# Patient Record
Sex: Male | Born: 1955
Health system: Southern US, Community
[De-identification: ages and names within clinical notes are randomized; demographics above are authoritative.]

## PROBLEM LIST (undated history)

## (undated) DIAGNOSIS — H905 Unspecified sensorineural hearing loss: Secondary | ICD-10-CM

## (undated) DIAGNOSIS — D229 Melanocytic nevi, unspecified: Secondary | ICD-10-CM

## (undated) DIAGNOSIS — S83519A Sprain of anterior cruciate ligament of unspecified knee, initial encounter: Secondary | ICD-10-CM

## (undated) DIAGNOSIS — E785 Hyperlipidemia, unspecified: Secondary | ICD-10-CM

## (undated) DIAGNOSIS — T7840XA Allergy, unspecified, initial encounter: Secondary | ICD-10-CM

## (undated) DIAGNOSIS — M6740 Ganglion, unspecified site: Secondary | ICD-10-CM

## (undated) HISTORY — PX: APPENDECTOMY: SHX54

## (undated) HISTORY — DX: Unspecified sensorineural hearing loss: H90.5

## (undated) HISTORY — DX: Allergy, unspecified, initial encounter: T78.40XA

## (undated) HISTORY — PX: VASECTOMY: SHX75

## (undated) HISTORY — PX: GANGLION CYST EXCISION: SHX1691

## (undated) HISTORY — DX: Ganglion, unspecified site: M67.40

## (undated) HISTORY — DX: Melanocytic nevi, unspecified: D22.9

## (undated) HISTORY — DX: Hyperlipidemia, unspecified: E78.5

## (undated) HISTORY — DX: Sprain of anterior cruciate ligament of unspecified knee, initial encounter: S83.519A

---
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
## 1999-08-14 ENCOUNTER — Other Ambulatory Visit: Admission: RE | Admit: 1999-08-14 | Discharge: 1999-08-14 | Payer: Self-pay | Admitting: Orthopedic Surgery

## 2001-11-18 ENCOUNTER — Ambulatory Visit (HOSPITAL_COMMUNITY): Admission: RE | Admit: 2001-11-18 | Discharge: 2001-11-18 | Payer: Self-pay | Admitting: Internal Medicine

## 2001-11-18 ENCOUNTER — Encounter: Payer: Self-pay | Admitting: Internal Medicine

## 2003-09-11 ENCOUNTER — Encounter: Admission: RE | Admit: 2003-09-11 | Discharge: 2003-09-11 | Payer: Self-pay | Admitting: Internal Medicine

## 2004-12-30 ENCOUNTER — Ambulatory Visit: Payer: Self-pay | Admitting: Internal Medicine

## 2005-04-02 ENCOUNTER — Encounter: Admission: RE | Admit: 2005-04-02 | Discharge: 2005-04-02 | Payer: Self-pay | Admitting: Internal Medicine

## 2005-04-02 ENCOUNTER — Ambulatory Visit: Payer: Self-pay | Admitting: Internal Medicine

## 2005-04-08 ENCOUNTER — Encounter: Admission: RE | Admit: 2005-04-08 | Discharge: 2005-04-29 | Payer: Self-pay | Admitting: Internal Medicine

## 2005-08-24 DIAGNOSIS — D229 Melanocytic nevi, unspecified: Secondary | ICD-10-CM

## 2005-08-24 HISTORY — DX: Melanocytic nevi, unspecified: D22.9

## 2006-01-05 ENCOUNTER — Ambulatory Visit: Payer: Self-pay | Admitting: Internal Medicine

## 2006-07-27 ENCOUNTER — Ambulatory Visit: Payer: Self-pay | Admitting: Internal Medicine

## 2006-08-10 ENCOUNTER — Ambulatory Visit: Payer: Self-pay | Admitting: Internal Medicine

## 2007-01-06 DIAGNOSIS — E785 Hyperlipidemia, unspecified: Secondary | ICD-10-CM | POA: Insufficient documentation

## 2007-01-06 DIAGNOSIS — Q169 Congenital malformation of ear causing impairment of hearing, unspecified: Secondary | ICD-10-CM

## 2007-01-10 ENCOUNTER — Encounter: Payer: Self-pay | Admitting: Internal Medicine

## 2007-01-10 ENCOUNTER — Ambulatory Visit: Payer: Self-pay | Admitting: Internal Medicine

## 2007-01-10 DIAGNOSIS — J309 Allergic rhinitis, unspecified: Secondary | ICD-10-CM | POA: Insufficient documentation

## 2007-01-10 DIAGNOSIS — D239 Other benign neoplasm of skin, unspecified: Secondary | ICD-10-CM | POA: Insufficient documentation

## 2007-01-18 LAB — CONVERTED CEMR LAB
ALT: 33 units/L (ref 0–40)
AST: 23 units/L (ref 0–37)
Albumin: 4.1 g/dL (ref 3.5–5.2)
Alkaline Phosphatase: 48 units/L (ref 39–117)
BUN: 13 mg/dL (ref 6–23)
Bilirubin, Direct: 0.1 mg/dL (ref 0.0–0.3)
CO2: 32 meq/L (ref 19–32)
Calcium: 9.4 mg/dL (ref 8.4–10.5)
Chloride: 109 meq/L (ref 96–112)
Cholesterol: 189 mg/dL (ref 0–200)
Creatinine, Ser: 0.9 mg/dL (ref 0.4–1.5)
GFR calc Af Amer: 115 mL/min
GFR calc non Af Amer: 95 mL/min
Glucose, Bld: 98 mg/dL (ref 70–99)
HDL: 47.6 mg/dL (ref >39.0)
Hemoglobin: 15.4 g/dL (ref 13.0–17.0)
LDL Cholesterol: 114 mg/dL — ABNORMAL HIGH (ref 0–99)
PSA: 1.74 ng/mL (ref 0.10–4.00)
Potassium: 4.3 meq/L (ref 3.5–5.1)
Sodium: 145 meq/L (ref 135–145)
Total Bilirubin: 0.7 mg/dL (ref 0.3–1.2)
Total CHOL/HDL Ratio: 4
Total Protein: 6.4 g/dL (ref 6.0–8.3)
Triglycerides: 135 mg/dL (ref 0–149)
VLDL: 27 mg/dL (ref 0–40)

## 2007-07-28 ENCOUNTER — Ambulatory Visit: Payer: Self-pay | Admitting: Internal Medicine

## 2007-08-25 HISTORY — PX: ROTATOR CUFF REPAIR: SHX139

## 2007-11-01 ENCOUNTER — Encounter: Admission: RE | Admit: 2007-11-01 | Discharge: 2007-12-14 | Payer: Self-pay | Admitting: Orthopedic Surgery

## 2007-11-29 ENCOUNTER — Ambulatory Visit: Payer: Self-pay | Admitting: Internal Medicine

## 2007-11-29 DIAGNOSIS — K644 Residual hemorrhoidal skin tags: Secondary | ICD-10-CM | POA: Insufficient documentation

## 2007-11-30 LAB — CONVERTED CEMR LAB
ALT: 35 units/L (ref 0–53)
AST: 22 units/L (ref 0–37)
BUN: 11 mg/dL (ref 6–23)
CO2: 32 meq/L (ref 19–32)
Calcium: 9.5 mg/dL (ref 8.4–10.5)
Chloride: 102 meq/L (ref 96–112)
Cholesterol: 173 mg/dL (ref 0–200)
Creatinine, Ser: 0.9 mg/dL (ref 0.4–1.5)
GFR calc Af Amer: 115 mL/min
GFR calc non Af Amer: 95 mL/min
Glucose, Bld: 94 mg/dL (ref 70–99)
HDL: 47.8 mg/dL (ref >39.0)
LDL Cholesterol: 92 mg/dL (ref 0–99)
Potassium: 4 meq/L (ref 3.5–5.1)
Sodium: 141 meq/L (ref 135–145)
Total CHOL/HDL Ratio: 3.6
Triglycerides: 168 mg/dL — ABNORMAL HIGH (ref 0–149)
VLDL: 34 mg/dL (ref 0–40)

## 2008-02-29 ENCOUNTER — Ambulatory Visit: Payer: Self-pay | Admitting: Internal Medicine

## 2008-03-02 LAB — CONVERTED CEMR LAB
ALT: 30 units/L (ref 0–53)
AST: 27 units/L (ref 0–37)
Cholesterol: 172 mg/dL (ref 0–200)
HDL: 45.4 mg/dL (ref >39.0)
LDL Cholesterol: 103 mg/dL — ABNORMAL HIGH (ref 0–99)
PSA: 1.13 ng/mL (ref 0.10–4.00)
Total CHOL/HDL Ratio: 3.8
Triglycerides: 116 mg/dL (ref 0–149)
VLDL: 23 mg/dL (ref 0–40)

## 2008-06-13 ENCOUNTER — Ambulatory Visit: Payer: Self-pay | Admitting: Internal Medicine

## 2008-06-14 LAB — CONVERTED CEMR LAB
BUN: 14 mg/dL (ref 6–23)
Basophils Relative: 0 % (ref 0.0–3.0)
CO2: 29 meq/L (ref 19–32)
Calcium: 9.3 mg/dL (ref 8.4–10.5)
Chloride: 105 meq/L (ref 96–112)
Creatinine, Ser: 1 mg/dL (ref 0.4–1.5)
Eosinophils Relative: 2.1 % (ref 0.0–5.0)
Glucose, Bld: 99 mg/dL (ref 70–99)
Hemoglobin: 15.6 g/dL (ref 13.0–17.0)
Lymphocytes Relative: 19.1 % (ref 12.0–46.0)
MCHC: 35.3 g/dL (ref 30.0–36.0)
MCV: 87.8 fL (ref 78.0–100.0)
Neutro Abs: 5.2 10*3/uL (ref 1.4–7.7)
Neutrophils Relative %: 70.1 % (ref 43.0–77.0)
RBC: 5.03 M/uL (ref 4.22–5.81)
WBC: 7.5 10*3/uL (ref 4.5–10.5)

## 2008-06-15 ENCOUNTER — Encounter (INDEPENDENT_AMBULATORY_CARE_PROVIDER_SITE_OTHER): Payer: Self-pay | Admitting: *Deleted

## 2008-06-22 ENCOUNTER — Ambulatory Visit: Payer: Self-pay | Admitting: Internal Medicine

## 2008-07-06 ENCOUNTER — Ambulatory Visit: Payer: Self-pay | Admitting: Internal Medicine

## 2008-07-06 DIAGNOSIS — Z860100 Personal history of colon polyps, unspecified: Secondary | ICD-10-CM | POA: Insufficient documentation

## 2008-07-18 ENCOUNTER — Encounter: Payer: Self-pay | Admitting: Internal Medicine

## 2008-09-18 ENCOUNTER — Telehealth (INDEPENDENT_AMBULATORY_CARE_PROVIDER_SITE_OTHER): Payer: Self-pay | Admitting: *Deleted

## 2008-12-06 ENCOUNTER — Ambulatory Visit: Payer: Self-pay | Admitting: Internal Medicine

## 2009-06-20 ENCOUNTER — Ambulatory Visit: Payer: Self-pay | Admitting: Internal Medicine

## 2009-08-07 ENCOUNTER — Encounter: Admission: RE | Admit: 2009-08-07 | Discharge: 2009-08-20 | Payer: Self-pay | Admitting: Orthopedic Surgery

## 2010-06-23 ENCOUNTER — Ambulatory Visit: Payer: Self-pay | Admitting: Internal Medicine

## 2010-06-23 LAB — CONVERTED CEMR LAB
Bilirubin Urine: NEGATIVE
Blood in Urine, dipstick: NEGATIVE
Glucose, Urine, Semiquant: NEGATIVE
Specific Gravity, Urine: 1.025
WBC Urine, dipstick: NEGATIVE
pH: 5

## 2010-06-26 LAB — CONVERTED CEMR LAB
AST: 24 units/L (ref 0–37)
Basophils Relative: 0.4 % (ref 0.0–3.0)
Calcium: 9.1 mg/dL (ref 8.4–10.5)
Chloride: 100 meq/L (ref 96–112)
Cholesterol: 139 mg/dL (ref 0–200)
Creatinine, Ser: 1 mg/dL (ref 0.4–1.5)
Eosinophils Relative: 4.6 % (ref 0.0–5.0)
HDL: 43.1 mg/dL (ref >39.00)
Lymphocytes Relative: 9.1 % — ABNORMAL LOW (ref 12.0–46.0)
Monocytes Relative: 7.9 % (ref 3.0–12.0)
Neutrophils Relative %: 78 % — ABNORMAL HIGH (ref 43.0–77.0)
RBC: 5.05 M/uL (ref 4.22–5.81)
TSH: 0.98 microintl units/mL (ref 0.35–5.50)
VLDL: 21.6 mg/dL (ref 0.0–40.0)
WBC: 10 10*3/uL (ref 4.5–10.5)

## 2010-09-12 ENCOUNTER — Telehealth (INDEPENDENT_AMBULATORY_CARE_PROVIDER_SITE_OTHER): Payer: Self-pay | Admitting: *Deleted

## 2010-09-21 LAB — CONVERTED CEMR LAB
BUN: 15 mg/dL (ref 6–23)
Basophils Absolute: 0 10*3/uL (ref 0.0–0.1)
CO2: 27 meq/L (ref 19–32)
Chloride: 99 meq/L (ref 96–112)
GFR calc non Af Amer: 83.58 mL/min (ref >60)
Glucose, Bld: 99 mg/dL (ref 70–99)
HCT: 45.2 % (ref 39.0–52.0)
Hemoglobin: 15.3 g/dL (ref 13.0–17.0)
Lymphs Abs: 1.5 10*3/uL (ref 0.7–4.0)
MCHC: 33.8 g/dL (ref 30.0–36.0)
MCV: 90.5 fL (ref 78.0–100.0)
Monocytes Absolute: 0.5 10*3/uL (ref 0.1–1.0)
Monocytes Relative: 7.7 % (ref 3.0–12.0)
Neutro Abs: 4.1 10*3/uL (ref 1.4–7.7)
PSA: 1.58 ng/mL (ref 0.10–4.00)
Potassium: 3.9 meq/L (ref 3.5–5.1)
RDW: 11.6 % (ref 11.5–14.6)
Sodium: 135 meq/L (ref 135–145)
VLDL: 18.6 mg/dL (ref 0.0–40.0)

## 2010-09-23 NOTE — Assessment & Plan Note (Signed)
Summary: cpx /lab/cbs   Vital Signs:  Patient profile:   55 year old male Height:      68.25 inches Weight:      201.38 pounds BMI:     30.51 Pulse rate:   76 / minute Pulse rhythm:   regular BP sitting:   124 / 86  (left arm) Cuff size:   large  Vitals Entered By: Army Fossa CMA (June 23, 2010 9:03 AM) CC: CPX, fasting Comments c/o head congestion since saturday- taking dayquil discuss td  declines flu shot [facility] [address]    History of Present Illness: CPX  Current Medications (verified): 1)  Crestor 10 Mg  Tabs (Rosuvastatin Calcium) .Marland Kitchen.. 1 By Mouth At Bedtime 2)  Chromium Picolinate 500 Mcg Tabs (Chromium Picolinate) .... Two Times A Day 3)  Fish Oil 1400 Mg  Allergies (verified): No Known Drug Allergies  Past History:  Past Medical History: Reviewed history from 06/13/2008 and no changes required. deafness, CONGENITAL HYPERLIPIDEMIA  ALLERGIC RHINITIS  NEVUS, ATYPICAL --2007, Dr Maricela Bo CYST X2    Past Surgical History: Reviewed history from 06/13/2008 and no changes required. Appendectomy Vasectomy Tonsillectomy & adenoidectomy rotato cuff surgery  2-09  Family History: Reviewed history from 06/13/2008 and no changes required. prostate cancer--no colon ca--no coronary artery disease--no  strokes--no  diabetes--no hypertension-- parents  Social History: Reviewed history from 06/20/2009 and no changes required. Married 2 daughters tobacco--no ETOH-- socially exercise -- gym x 3 eats healthy patient is a Architectural technologist, works from home   Review of Systems General:  in general doing well. CV:  no chest pain or lower extremity edema. Resp:  having a cold for the last 3 days, other than the cold he denies cough. Occasionally gets short of breath  ("hard to get a deep breath"). GI:  Denies abdominal pain, bloody stools, nausea, and vomiting; no GERD symptoms. GU:  his urinary stream is slightly lower than before. No gross  hematuria or dysuria. Derm:  reports that he saw his dermatologist  few months ago but since then, a mole in the scalp has increased in size.  Physical Exam  General:  alert, well-developed, and overweight-appearing.   Head:  face symmetric, not tender to palpation Ears:  R ear normal and L ear normal.   Nose:  congestive Mouth:  no redness or discharge Lungs:  normal respiratory effort, no intercostal retractions, no accessory muscle use, and normal breath sounds.   Heart:  normal rate, regular rhythm, no murmur, and no gallop.   Abdomen:  soft, non-tender, no distention, no masses, no guarding, and no rigidity.   Rectal:  a small skin tag noted outside the anus. Normal sphincter tone. No rectal masses or tenderness. Prostate:  Prostate gland firm and smooth, no enlargement, nodularity, tenderness, mass, asymmetry or induration. Extremities:  no pretibial edema bilaterally  Psych:  Oriented X3, memory intact for recent and remote, normally interactive, good eye contact, not anxious appearing, and not depressed appearing.     Impression & Recommendations:  Problem # 1:  HEALTH MAINTENANCE EXAM (ICD-V70.0) Td 08 continue healthy life style   Cscope 11-09 4 MM ASCENDING COLON POLYP REMOVED, NOT RECOVERED, SIGMOID DIVERTICULOSIS, OTHERWISE NORMAL   explained benefit of flu shot--declined  ('I get sick')   PT IS DEAF, PHONE #  [phone] (RELAY PHONE)  Recommend to continue to be physically active, information provided regards diet   Orders: Venipuncture (09811) TLB-BMP (Basic Metabolic Panel-BMET) (80048-METABOL) TLB-CBC Platelet - w/Differential (85025-CBCD) TLB-ALT (  SGPT) (84460-ALT) TLB-AST (SGOT) (84450-SGOT) TLB-Lipid Panel (80061-LIPID) TLB-PSA (Prostate Specific Antigen) (84153-PSA) TLB-TSH (Thyroid Stimulating Hormone) (84443-TSH)  Problem # 2:  URI see instructions   Problem # 3:  NEVUS, ATYPICAL (ICD-216.9) a mole in his scalp has increased in size, recommend  to see his dermatologist  Complete Medication List: 1)  Crestor 10 Mg Tabs (Rosuvastatin calcium) .Marland Kitchen.. 1 by mouth at bedtime 2)  Chromium Picolinate 500 Mcg Tabs (Chromium picolinate) .... Two times a day 3)  Fish Oil 1400 Mg   Patient Instructions: 1)  for the cold, take fluids, Mucinex DM, Tylenol if needed and call in few days if you are no better 2)  please see your dermatologist in reference to the mole in your scalp 3)  Come back in one year, fasting, physical  exam   Orders Added: 1)  Venipuncture [36415] 2)  TLB-BMP (Basic Metabolic Panel-BMET) [80048-METABOL] 3)  TLB-CBC Platelet - w/Differential [85025-CBCD] 4)  TLB-ALT (SGPT) [84460-ALT] 5)  TLB-AST (SGOT) [84450-SGOT] 6)  TLB-Lipid Panel [80061-LIPID] 7)  TLB-PSA (Prostate Specific Antigen) [84153-PSA] 8)  TLB-TSH (Thyroid Stimulating Hormone) [84443-TSH] 9)  Est. Patient age 42-64 [99396]      Laboratory Results   Urine Tests  Date/Time Received: Lucious Groves [facility]  June 23, 2010 10:22 AM Date/Time Reported: Lucious Groves CMA  June 23, 2010 10:22 AM  Routine Urinalysis   Color: yellow Appearance: Clear Glucose: negative   (Normal Range: Negative) Bilirubin: negative   (Normal Range: Negative) Ketone: negative   (Normal Range: Negative) Spec. Gravity: 1.025   (Normal Range: 1.003-1.035) Blood: negative   (Normal Range: Negative) pH: 5.0   (Normal Range: 5.0-8.0) Protein: negative   (Normal Range: Negative) Urobilinogen: 0.2   (Normal Range: 0-1) Nitrite: negative   (Normal Range: Negative) Leukocyte Esterace: negative   (Normal Range: Negative)    Comments: No cause for culture. Lucious Groves CMA  June 23, 2010 10:22 AM

## 2010-09-25 NOTE — Progress Notes (Signed)
Summary: Refill Request  Phone Note Refill Request Call back at [phone] Message from:  Pharmacy on September 12, 2010 11:23 AM  Refills Requested: Medication #1:  CRESTOR 10 MG  TABS 1 by mouth at bedtime   Dosage confirmed as above?Dosage Confirmed   Supply Requested: 3 months   Last Refilled: 07/03/2010 [facility]  Next Appointment Scheduled: none Initial call taken by: Harold Barban,  September 12, 2010 11:23 AM    Prescriptions: CRESTOR 10 MG  TABS (ROSUVASTATIN CALCIUM) 1 by mouth at bedtime  #90 Tablet x 1   Entered and Authorized by:   Army Fossa CMA   Signed by:   Army Fossa CMA on 09/12/2010   Method used:   Faxed to ...       [facility]order)       [address] [place], [place]  [address]       Ph: [phone]       Fax: [phone]   RxID:   [id number]

## 2011-04-20 ENCOUNTER — Other Ambulatory Visit: Payer: Self-pay | Admitting: Internal Medicine

## 2011-05-04 ENCOUNTER — Other Ambulatory Visit: Payer: Self-pay | Admitting: Internal Medicine

## 2011-05-06 MED ORDER — ROSUVASTATIN CALCIUM 10 MG PO TABS: 10.0000 mg | ORAL_TABLET | Freq: Every day | ORAL | Status: DC

## 2011-05-06 NOTE — Telephone Encounter (Signed)
Done

## 2011-06-25 ENCOUNTER — Encounter: Payer: Self-pay | Admitting: Internal Medicine

## 2011-06-29 ENCOUNTER — Encounter: Payer: Self-pay | Admitting: Internal Medicine

## 2011-06-29 ENCOUNTER — Ambulatory Visit (INDEPENDENT_AMBULATORY_CARE_PROVIDER_SITE_OTHER): Payer: Self-pay | Admitting: Internal Medicine

## 2011-06-29 DIAGNOSIS — D239 Other benign neoplasm of skin, unspecified: Secondary | ICD-10-CM

## 2011-06-29 DIAGNOSIS — Z0000 Encounter for general adult medical examination without abnormal findings: Secondary | ICD-10-CM | POA: Insufficient documentation

## 2011-06-29 LAB — COMPREHENSIVE METABOLIC PANEL
AST: 22 U/L (ref 0–37)
Albumin: 4.4 g/dL (ref 3.5–5.2)
Alkaline Phosphatase: 57 U/L (ref 39–117)
Calcium: 9.6 mg/dL (ref 8.4–10.5)
Chloride: 104 mEq/L (ref 96–112)
Glucose, Bld: 109 mg/dL — ABNORMAL HIGH (ref 70–99)
Potassium: 4.5 mEq/L (ref 3.5–5.1)
Sodium: 142 mEq/L (ref 135–145)
Total Protein: 7 g/dL (ref 6.0–8.3)

## 2011-06-29 LAB — CBC WITH DIFFERENTIAL/PLATELET
Basophils Absolute: 0 10*3/uL (ref 0.0–0.1)
HCT: 44.8 % (ref 39.0–52.0)
Hemoglobin: 15.2 g/dL (ref 13.0–17.0)
Lymphs Abs: 1.3 10*3/uL (ref 0.7–4.0)
MCHC: 33.9 g/dL (ref 30.0–36.0)
Monocytes Relative: 6.8 % (ref 3.0–12.0)
Neutro Abs: 4.2 10*3/uL (ref 1.4–7.7)
RDW: 12.6 % (ref 11.5–14.6)

## 2011-06-29 LAB — LIPID PANEL
Cholesterol: 174 mg/dL (ref 0–200)
LDL Cholesterol: 92 mg/dL (ref 0–99)
Total CHOL/HDL Ratio: 3

## 2011-06-29 LAB — TSH: TSH: 0.95 u[IU]/mL (ref 0.35–5.50)

## 2011-06-29 MED ORDER — ROSUVASTATIN CALCIUM 10 MG PO TABS: 10.0000 mg | ORAL_TABLET | Freq: Every day | ORAL | Status: DC

## 2011-06-29 NOTE — Assessment & Plan Note (Signed)
Reports he was cleared by dermatology, no further eval at this time

## 2011-06-29 NOTE — Patient Instructions (Signed)
Will call results to: [phone] (RELAY PHONE)

## 2011-06-29 NOTE — Progress Notes (Signed)
  Subjective:    Patient ID: Brian Gibson, male    DOB: 1956-07-24, 55 y.o.   MRN: [id number]  HPI Complete physical exam, doing well, recently participated in a boot camp, lost weight and feeling great.  Past Medical History  Diagnosis Date  . Congenital deafness   . Hyperlipidemia   . Allergy     RHINITIS  . Atypical nevus 2007    dr Elliot Gurney  . Ganglion    Past Surgical History  Procedure Date  . Appendectomy   . Vasectomy   . Rotator cuff repair 2009  . 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 Denies any anxiety or depression, occasionally has difficulty sleeping,  sx well controlled with over-the-counter meds.     Objective:   Physical Exam  Constitutional: He is oriented to person, place, and time. He appears well-developed and well-nourished. No distress.  HENT:  Head: Normocephalic and atraumatic.  Neck: No thyromegaly present.       Normal carotid pulses  Cardiovascular: Normal rate, regular rhythm and normal heart sounds.   No murmur heard. Pulmonary/Chest: Effort normal and breath sounds normal. No respiratory distress. He has no wheezes. He has no rales.  Abdominal: Soft. Bowel sounds are normal. He exhibits no distension. There is no tenderness. There is no rebound and no guarding.  Genitourinary: Rectum normal and prostate normal.  Musculoskeletal: He exhibits no edema.  Neurological: He is alert and oriented to person, place, and time.  Skin: Skin is warm and dry. He is not diaphoretic.    Psychiatric: He has a normal mood and affect. His behavior is normal. Judgment and thought content normal.       Assessment & Plan:

## 2011-06-29 NOTE — Assessment & Plan Note (Signed)
Td 08 Offered a flu shot Cscope 11-09 4 MM ASCENDING COLON POLYP REMOVED, NOT RECOVERED, SIGMOID DIVERTICULOSIS, OTHERWISE NORMAL --- next 2014 per report  PT IS DEAF, PHONE #  [phone] (RELAY PHONE) Counseled about diet and physically active

## 2011-07-02 ENCOUNTER — Telehealth: Payer: Self-pay

## 2011-07-02 NOTE — Telephone Encounter (Signed)
Message copied by Francisco Capuchin on Thu Jul 02, 2011  8:12 AM ------      Message from: Willow Ora E      Created: Mon Jun 29, 2011  6:37 PM       PT IS DEAF,  CALL RESULTS TO #  [phone] (RELAY PHONE)      All labs ok, blood sugar slt elevated, needs to be rechecked yearly      Good results

## 2011-07-02 NOTE — Telephone Encounter (Signed)
Pt called no answer, letter with results mailed to home adressed 

## 2011-07-02 NOTE — Telephone Encounter (Signed)
Pt called no answer, letter with results mailed to home adressed

## 2011-10-07 ENCOUNTER — Other Ambulatory Visit: Payer: Self-pay | Admitting: Internal Medicine

## 2011-10-07 ENCOUNTER — Telehealth: Payer: Self-pay | Admitting: Internal Medicine

## 2011-10-07 MED ORDER — ROSUVASTATIN CALCIUM 10 MG PO TABS: 10.0000 mg | ORAL_TABLET | Freq: Every day | ORAL | Status: DC

## 2011-10-07 NOTE — Telephone Encounter (Signed)
The pt is hoping to get a refill on his crestor 10mg  tab  Sent to [facility]   Thanks!

## 2011-10-07 NOTE — Telephone Encounter (Signed)
Please advise 

## 2011-10-07 NOTE — Telephone Encounter (Signed)
Refill done.  

## 2011-10-07 NOTE — Telephone Encounter (Signed)
[facility] has faxed over a form and stated they do not cover Crestor 10mg  tabs.  They are hoping for a generic substitute for this med.  Please fax over new med rx to [phone] Thanks!

## 2011-10-08 MED ORDER — ATORVASTATIN CALCIUM 20 MG PO TABS: 20.0000 mg | ORAL_TABLET | Freq: Every day | ORAL | Status: DC

## 2011-10-08 NOTE — Telephone Encounter (Signed)
Changed to Lipitor 20 mg one by mouth each bedtime, call #[phone],  4 refills.  Scheduled visit in 2 months, fasting

## 2011-10-08 NOTE — Telephone Encounter (Signed)
Sent in prescription for Lipitor. Tried to schedule pt for labs but he refused. Pt states he is low on money & does not want to pay.

## 2011-10-12 ENCOUNTER — Other Ambulatory Visit: Payer: Self-pay | Admitting: Internal Medicine

## 2011-10-12 MED ORDER — ATORVASTATIN CALCIUM 20 MG PO TABS: 20.0000 mg | ORAL_TABLET | Freq: Every day | ORAL | Status: DC

## 2011-10-12 NOTE — Telephone Encounter (Signed)
Refill done.  

## 2011-12-09 ENCOUNTER — Telehealth: Payer: Self-pay | Admitting: *Deleted

## 2011-12-09 MED ORDER — FLUTICASONE PROPIONATE 50 MCG/ACT NA SUSP: 2.0000 | Freq: Every day | NASAL | Status: DC

## 2011-12-09 NOTE — Telephone Encounter (Signed)
Flonase 2 sprays on each side of the nose daily, #1 bottle, 3 refills. Office visit if symptoms severe or if he has fever or chills.

## 2011-12-09 NOTE — Telephone Encounter (Signed)
Pt left VM that he has been bother with sneezing,nasal and throat drainage,watery eyes. Pt notes that he has been using his wife Rx for fluticasone and it has help with his symptoms. Pt is now requesting to have Rx sent in for him. .please advise

## 2011-12-09 NOTE — Telephone Encounter (Signed)
Spoke with pt, sent in rx.  

## 2012-04-28 ENCOUNTER — Telehealth: Payer: Self-pay | Admitting: Internal Medicine

## 2012-04-28 NOTE — Telephone Encounter (Signed)
Patient calling, has had lower abd. pain x 7 days.  He is able to urinate and have bm's.  The pain is intermittent and on the lower right side.  Had an appendectomy.  The pain worsens with movement and walking.  No fever.  No blood in the stools or urine.  No decreased appetite.   Home care given.  He will call if any sx worsens.  He uses an interpretor and asks that we give them time to answer as well.

## 2012-06-30 ENCOUNTER — Ambulatory Visit (INDEPENDENT_AMBULATORY_CARE_PROVIDER_SITE_OTHER): Payer: BC Managed Care – HMO | Admitting: Internal Medicine

## 2012-06-30 ENCOUNTER — Encounter: Payer: Self-pay | Admitting: Internal Medicine

## 2012-06-30 VITALS — BP 132/76 | HR 65 | Temp 98.8°F | Ht 68.5 in | Wt 197.0 lb

## 2012-06-30 DIAGNOSIS — Z0000 Encounter for general adult medical examination without abnormal findings: Secondary | ICD-10-CM

## 2012-06-30 DIAGNOSIS — E785 Hyperlipidemia, unspecified: Secondary | ICD-10-CM

## 2012-06-30 LAB — POCT URINALYSIS DIPSTICK
Bilirubin, UA: NEGATIVE
Blood, UA: NEGATIVE
Ketones, UA: NEGATIVE
Protein, UA: NEGATIVE
Spec Grav, UA: <=1.005
pH, UA: 6.5

## 2012-06-30 LAB — COMPREHENSIVE METABOLIC PANEL
ALT: 28 U/L (ref 0–53)
Alkaline Phosphatase: 60 U/L (ref 39–117)
Sodium: 138 mEq/L (ref 135–145)
Total Bilirubin: 0.7 mg/dL (ref 0.3–1.2)
Total Protein: 7.1 g/dL (ref 6.0–8.3)

## 2012-06-30 LAB — LIPID PANEL
Cholesterol: 193 mg/dL (ref 0–200)
HDL: 52.8 mg/dL (ref >39.00)
LDL Cholesterol: 122 mg/dL — ABNORMAL HIGH (ref 0–99)
Total CHOL/HDL Ratio: 4
Triglycerides: 89 mg/dL (ref 0.0–149.0)
VLDL: 17.8 mg/dL (ref 0.0–40.0)

## 2012-06-30 LAB — CBC WITH DIFFERENTIAL/PLATELET
Basophils Absolute: 0 10*3/uL (ref 0.0–0.1)
HCT: 45.7 % (ref 39.0–52.0)
Lymphs Abs: 1.5 10*3/uL (ref 0.7–4.0)
MCHC: 33 g/dL (ref 30.0–36.0)
MCV: 89.7 fl (ref 78.0–100.0)
Monocytes Absolute: 0.5 10*3/uL (ref 0.1–1.0)
Platelets: 259 10*3/uL (ref 150.0–400.0)
RDW: 12.4 % (ref 11.5–14.6)

## 2012-06-30 MED ORDER — ROSUVASTATIN CALCIUM 10 MG PO TABS: 10.0000 mg | ORAL_TABLET | Freq: Every day | ORAL | Status: DC

## 2012-06-30 NOTE — Progress Notes (Signed)
  Subjective:    Patient ID: Brian Gibson, male    DOB: 21-Nov-1955, 56 y.o.   MRN: [id number]  HPI CPX  Past Medical History  Diagnosis Date  . Congenital deafness   . Hyperlipidemia   . Allergy     RHINITIS  . Atypical nevus 2007    dr Elliot Gurney  . Ganglion    Past Surgical History  Procedure Date  . Appendectomy   . Vasectomy   . Rotator cuff repair 2009  . Tonsillectomy    Family History  Problem Relation Age of Onset  . Hypertension Mother   . Hypertension Father   . Cancer Neg Hx   . Diabetes Neg Hx   . Stroke Neg Hx   . CAD Neg Hx    History   Social History  . Marital Status: Married    Spouse Name: N/A    Number of Children: 2  . Years of Education: N/A   Occupational History  . COMPUTER PROGRAMMER    Social History Main Topics  . Smoking status: Current Some Day Smoker  . Smokeless tobacco: Not on file     Comment: occasional cigar  . Alcohol Use: Yes     Comment: socially   . Drug Use: No  . Sexually Active: Not on file   Other Topics Concern  . Not on file   Social History Narrative   Eats healthy, + regular exercise      Review of Systems Since the last time he was here he is doing well. He remains active. Good compliance with Crestor. A week ago developed a pain @ left side of the abdomen, "only when I push" Denies fever, chills. Pain is no worse with eating. No  nausea, vomiting, diarrhea or blood in the stools. No chest pain or shortness of breath. No dysuria or gross hematuria, no difficulty urinating    Objective:   Physical Exam  Abdominal:     General -- alert, well-developed   Neck --no thyromegaly , normal carotid pulse Lungs -- normal respiratory effort, no intercostal retractions, no accessory muscle use, and normal breath sounds.   Heart-- normal rate, regular rhythm, no murmur, and no gallop.   Abdomen-- not distended, soft, slightly tender at the left side of the abdomen without mass, rebound. Good bowel  sounds. Extremities-- no pretibial edema bilaterally Rectal-- very small  External hemorrhoid  noted. Normal sphincter tone. No rectal masses or tenderness. Brown stool, Hemoccult negative Prostate:  Prostate gland firm and smooth, no enlargement, nodularity, tenderness, mass, asymmetry or induration. Neurologic-- alert & oriented X3 and strength normal in all extremities. Psych-- Cognition and judgment appear intact. Alert and cooperative with normal attention span and concentration.  not anxious appearing and not depressed appearing.       Assessment & Plan:

## 2012-06-30 NOTE — Patient Instructions (Addendum)
Continue the medications the same way. It just sent a year supply of crestor to your pharmacy Continue to  eat healthy and stay active If the stomach pain gets worse, you have fever, chills, nausea or blood in the stools --->  let me know. Also call if you still have the pain  in the next 2 or 3 weeks

## 2012-06-30 NOTE — Assessment & Plan Note (Signed)
On Crestor, good compliance, labs

## 2012-06-30 NOTE — Assessment & Plan Note (Addendum)
Td 08 Offered a flu shot Cscope 11-09---> 4 MM ASCENDING COLON POLYP REMOVED, NOT RECOVERED, SIGMOID DIVERTICULOSIS, OTHERWISE NORMAL --- next 2014 per report  Patient is deaf, encouraged to use my chart or will call results to #  [phone] (RELAY PHONE) Counseled about diet and physically active Labs  Abdominal pain without red flag symptoms, urine dip negative, see instructions

## 2012-07-01 ENCOUNTER — Encounter: Payer: Self-pay | Admitting: Internal Medicine

## 2012-07-04 ENCOUNTER — Encounter: Payer: Self-pay | Admitting: *Deleted

## 2012-09-30 ENCOUNTER — Encounter: Payer: Self-pay | Admitting: Internal Medicine

## 2012-10-03 ENCOUNTER — Telehealth: Payer: Self-pay | Admitting: *Deleted

## 2012-10-03 MED ORDER — SILDENAFIL CITRATE 100 MG PO TABS: ORAL_TABLET | ORAL | Status: DC

## 2012-10-03 NOTE — Telephone Encounter (Signed)
Refill done.  

## 2012-10-08 ENCOUNTER — Other Ambulatory Visit: Payer: Self-pay

## 2012-11-11 ENCOUNTER — Encounter: Payer: Self-pay | Admitting: Internal Medicine

## 2012-12-06 ENCOUNTER — Telehealth: Payer: Self-pay | Admitting: *Deleted

## 2012-12-06 ENCOUNTER — Encounter: Payer: Self-pay | Admitting: Internal Medicine

## 2012-12-06 MED ORDER — ROSUVASTATIN CALCIUM 10 MG PO TABS: 10.0000 mg | ORAL_TABLET | Freq: Every day | ORAL | Status: DC

## 2012-12-06 NOTE — Telephone Encounter (Signed)
Refill done.  

## 2012-12-07 ENCOUNTER — Telehealth: Payer: Self-pay | Admitting: *Deleted

## 2012-12-07 ENCOUNTER — Encounter: Payer: Self-pay | Admitting: Internal Medicine

## 2012-12-07 ENCOUNTER — Other Ambulatory Visit: Payer: Self-pay | Admitting: *Deleted

## 2012-12-07 MED ORDER — FLUTICASONE PROPIONATE 50 MCG/ACT NA SUSP: 2.0000 | Freq: Every day | NASAL | Status: DC

## 2012-12-07 NOTE — Telephone Encounter (Signed)
Refill done.  

## 2012-12-13 ENCOUNTER — Encounter: Payer: Self-pay | Admitting: Family Medicine

## 2012-12-13 ENCOUNTER — Ambulatory Visit (HOSPITAL_BASED_OUTPATIENT_CLINIC_OR_DEPARTMENT_OTHER)
Admission: RE | Admit: 2012-12-13 | Discharge: 2012-12-13 | Disposition: A | Discharge status: Home / Self Care | Payer: BC Managed Care – PPO | Source: Ambulatory Visit | Attending: Family Medicine | Admitting: Family Medicine

## 2012-12-13 ENCOUNTER — Telehealth: Payer: Self-pay | Admitting: Internal Medicine

## 2012-12-13 ENCOUNTER — Ambulatory Visit (INDEPENDENT_AMBULATORY_CARE_PROVIDER_SITE_OTHER): Payer: BC Managed Care – PPO | Admitting: Family Medicine

## 2012-12-13 VITALS — BP 124/82 | HR 62 | Temp 98.4°F | Wt 201.8 lb

## 2012-12-13 DIAGNOSIS — M549 Dorsalgia, unspecified: Secondary | ICD-10-CM

## 2012-12-13 DIAGNOSIS — M47812 Spondylosis without myelopathy or radiculopathy, cervical region: Secondary | ICD-10-CM | POA: Insufficient documentation

## 2012-12-13 MED ORDER — HYDROCODONE-ACETAMINOPHEN 5-325 MG PO TABS: 1.0000 | ORAL_TABLET | Freq: Four times a day (QID) | ORAL | Status: DC | PRN

## 2012-12-13 NOTE — Telephone Encounter (Signed)
Pt has an appt today with Dr. Laury Axon.

## 2012-12-13 NOTE — Telephone Encounter (Signed)
Patient Information:  Caller Name: Gedeon  Phone: [phone]  Patient: Brian Gibson, Brian Gibson  Gender: Male  DOB: 01-14-1956  Age: 57 Years  PCP: Willow Ora  Office Follow Up:  Does the office need to follow up with this patient?: No  Instructions For The Office: N/A  RN Note:  (triage using interpreter)  Pt rates his pain as 0 with walking, 2 with sitting, 8 with laying down.. Bruise in 3 inches in the middle of the spine.  Symptoms  Reason For Call & Symptoms: pt was hiking over a creek and slipped on a log and fell in the creek landing on his back.  Pt landed on a rock.  Pt has pain getting onto a bed and going into a  Reviewed Health History In EMR: Yes  Reviewed Medications In EMR: Yes  Reviewed Allergies In EMR: Yes  Reviewed Surgeries / Procedures: Yes  Date of Onset of Symptoms: 12/10/2012  Treatments Tried: Motrin  Treatments Tried Worked: Yes  Guideline(s) Used:  Back Injury  Disposition Per Guideline:   See Within 3 Days in Office  Reason For Disposition Reached:   Injury and pain has not improved after 3 days  Advice Given:  N/A  Patient Will Follow Care Advice:  YES  Appointment Scheduled:  12/13/2012 15:30:00 Appointment Scheduled Provider:  Lelon Perla.  (Dr Drue Novel appts were booked.  RN offered appt for 12/14/12 but pt preferred to be seen today)

## 2012-12-13 NOTE — Patient Instructions (Signed)
Back Pain, Adult Low back pain is very common. About 1 in 5 people have back pain.The cause of low back pain is rarely dangerous. The pain often gets better over time.About half of people with a sudden onset of back pain feel better in just 2 weeks. About 8 in 10 people feel better by 6 weeks.  CAUSES Some common causes of back pain include:  Strain of the muscles or ligaments supporting the spine.  Wear and tear (degeneration) of the spinal discs.  Arthritis.  Direct injury to the back. DIAGNOSIS Most of the time, the direct cause of low back pain is not known.However, back pain can be treated effectively even when the exact cause of the pain is unknown.Answering your caregiver's questions about your overall health and symptoms is one of the most accurate ways to make sure the cause of your pain is not dangerous. If your caregiver needs more information, he or she may order lab work or imaging tests (X-rays or MRIs).However, even if imaging tests show changes in your back, this usually does not require surgery. HOME CARE INSTRUCTIONS For many people, back pain returns.Since low back pain is rarely dangerous, it is often a condition that people can learn to manageon their own.   Remain active. It is stressful on the back to sit or stand in one place. Do not sit, drive, or stand in one place for more than 30 minutes at a time. Take short walks on level surfaces as soon as pain allows.Try to increase the length of time you walk each day.  Do not stay in bed.Resting more than 1 or 2 days can delay your recovery.  Do not avoid exercise or work.Your body is made to move.It is not dangerous to be active, even though your back may hurt.Your back will likely heal faster if you return to being active before your pain is gone.  Pay attention to your body when you bend and lift. Many people have less discomfortwhen lifting if they bend their knees, keep the load close to their bodies,and  avoid twisting. Often, the most comfortable positions are those that put less stress on your recovering back.  Find a comfortable position to sleep. Use a firm mattress and lie on your side with your knees slightly bent. If you lie on your back, put a pillow under your knees.  Only take over-the-counter or prescription medicines as directed by your caregiver. Over-the-counter medicines to reduce pain and inflammation are often the most helpful.Your caregiver may prescribe muscle relaxant drugs.These medicines help dull your pain so you can more quickly return to your normal activities and healthy exercise.  Put ice on the injured area.  Put ice in a plastic bag.  Place a towel between your skin and the bag.  Leave the ice on for 15 to 20 minutes, 3 to 4 times a day for the first 2 to 3 days. After that, ice and heat may be alternated to reduce pain and spasms.  Ask your caregiver about trying back exercises and gentle massage. This may be of some benefit.  Avoid feeling anxious or stressed.Stress increases muscle tension and can worsen back pain.It is important to recognize when you are anxious or stressed and learn ways to manage it.Exercise is a great option. SEEK MEDICAL CARE IF:  You have pain that is not relieved with rest or medicine.  You have pain that does not improve in 1 week.  You have new symptoms.  You are generally   not feeling well. SEEK IMMEDIATE MEDICAL CARE IF:   You have pain that radiates from your back into your legs.  You develop new bowel or bladder control problems.  You have unusual weakness or numbness in your arms or legs.  You develop nausea or vomiting.  You develop abdominal pain.  You feel faint. Document Released: 08/10/2005 Document Revised: 02/09/2012 Document Reviewed: 12/29/2010 [employer] Patient Information 2013 [employer].  

## 2012-12-13 NOTE — Progress Notes (Signed)
  Subjective:    Brian Gibson is a 57 y.o. male who presents for evaluation of low back pain. The patient has had no prior back problems. Symptoms have been present for 6 days and are gradually worsening.  Onset was related to / precipitated by a fall. The pain is located in the R thoracic spine and does not radiate. The pain is described as sharp and occurs all day. He rates his pain as a 8 on a scale of 0-10. Symptoms are exacerbated by extension and getting in and out of bed. Symptoms are improved by narcotic pain medications. He has also tried NSAIDs which provided no symptom relief. He has no other symptoms associated with the back pain. The patient has no "red flag" history indicative of complicated back pain.  The following portions of the patient's history were reviewed and updated as appropriate: allergies, current medications, past family history, past medical history, past social history, past surgical history and problem list.  Review of Systems Pertinent items are noted in HPI.    Objective:   Normal reflexes, gait, strength and negative straight-leg raise. Inspection and palpation: antalgic gait, pain with extension of back . Muscle tone and ROM exam: limited range of motion with pain, antalgic gait.    Assessment:    Nonspecific acute low back pain    Plan:    Educational material distributed. Regular aerobic and trunk strengthening exercises discussed. Short (2-4 day) period of relative rest recommended until acute symptoms improve. Ice to affected area as needed for local pain relief. Heat to affected area as needed for local pain relief. xrays

## 2013-01-25 ENCOUNTER — Encounter: Payer: Self-pay | Admitting: Lab

## 2013-01-25 ENCOUNTER — Encounter: Payer: Self-pay | Admitting: *Deleted

## 2013-01-25 ENCOUNTER — Encounter: Payer: Self-pay | Admitting: Internal Medicine

## 2013-01-25 ENCOUNTER — Ambulatory Visit (INDEPENDENT_AMBULATORY_CARE_PROVIDER_SITE_OTHER): Payer: BC Managed Care – PPO | Admitting: Internal Medicine

## 2013-01-25 VITALS — BP 116/76 | HR 66 | Temp 98.2°F | Wt 195.0 lb

## 2013-01-25 DIAGNOSIS — Z23 Encounter for immunization: Secondary | ICD-10-CM

## 2013-01-25 DIAGNOSIS — J209 Acute bronchitis, unspecified: Secondary | ICD-10-CM

## 2013-01-25 MED ORDER — AZITHROMYCIN 250 MG PO TABS: ORAL_TABLET | ORAL | Status: DC

## 2013-01-25 MED ORDER — HYDROCODONE-HOMATROPINE 5-1.5 MG/5ML PO SYRP: 5.0000 mL | ORAL_SOLUTION | Freq: Four times a day (QID) | ORAL | Status: DC | PRN

## 2013-01-25 NOTE — Progress Notes (Signed)
  Subjective:    Patient ID: Brian Gibson, male    DOB: 1956-03-04, 57 y.o.   MRN: [id number]  HPI   Symptoms began 01/20/13 as a sore throat; as of 6/1 he developed a nonproductive cough. He also had frontal headache. He has developed yellow sputum production with some intermittent shortness of breath.  He treated his symptoms with Mucinex DM and Excedrin with partial response  He smokes cigars occasionally but has not had a cigar for 4 weeks. He has no history of asthma    Review of Systems He denies facial pain or significant nasal purulence. He is not having  fever, chills, or sweats.  He denies wheezing or pleuritic chest pain.  He has no pelvic pain or discharge     Objective:   Physical Exam General appearance:good health ;well nourished; no acute distress or increased work of breathing is present.  No  lymphadenopathy about the head, neck, or axilla noted.   Eyes: No conjunctival inflammation or lid edema is present.  Ears:  External ear exam shows no significant lesions or deformities.  Otoscopic examination reveals clear canals, tympanic membranes are intact bilaterally without bulging, retraction, inflammation or discharge.Deaf  Nose:  External nasal examination shows no deformity or inflammation. Nasal mucosa are pink and moist without lesions or exudates. Septal  Deviation to R.No obstruction to airflow.   Oral exam: Dental hygiene is good; lips and gums are healthy appearing.There is no oropharyngeal erythema or exudate noted.   Neck:  No deformities,  masses, or tenderness noted.     Heart:  Normal rate and regular rhythm. S1 and S2 normal without gallop, murmur, click, rub or other extra sounds.   Lungs:Chest clear to auscultation; no wheezes, rhonchi,rales ,or rubs present. Breath sounds appear slightly decreased in the right upper lobe posteriorly. No increased work of breathing.    Extremities:  No cyanosis, edema, or clubbing  noted    Skin: Warm &  dry         Assessment & Plan:  #1 acute bronchitis w/o bronchospasm #2 no rhinosinusitis Plan: See orders and recommendations

## 2013-01-25 NOTE — Patient Instructions (Addendum)
Please call if symptoms do not resolve over 36-48 hours. In that case a chest x-ray will be performed  at [facility] , [employer]

## 2013-04-17 ENCOUNTER — Ambulatory Visit (INDEPENDENT_AMBULATORY_CARE_PROVIDER_SITE_OTHER): Payer: BC Managed Care – PPO | Admitting: Nurse Practitioner

## 2013-04-17 ENCOUNTER — Encounter: Payer: Self-pay | Admitting: Nurse Practitioner

## 2013-04-17 VITALS — BP 120/76 | HR 71 | Temp 98.2°F | Ht 68.5 in | Wt 197.2 lb

## 2013-04-17 DIAGNOSIS — R109 Unspecified abdominal pain: Secondary | ICD-10-CM

## 2013-04-17 DIAGNOSIS — R809 Proteinuria, unspecified: Secondary | ICD-10-CM

## 2013-04-17 LAB — POCT URINALYSIS DIPSTICK
Leukocytes, UA: NEGATIVE
Nitrite, UA: NEGATIVE
pH, UA: 6

## 2013-04-17 NOTE — Progress Notes (Signed)
Subjective:     Brian Gibson is a 57 y.o. male who presents for evaluation of L flank pain. Onset was 4 days ago. Symptoms have been unchanged. The pain is described as dull, and is 3/10 in intensity. Pain is located in the costovertebral angle on the left without radiation.  Aggravating factors: lying down.  Alleviating factors: none. Associated symptoms: none. The patient denies anorexia, belching, chills, constipation, diarrhea, dysuria, fever, frequency, hematuria, nausea and vomiting. Pt states he was doing physical work at his home over weekend & wonders if he may have strained muscle.  The patient's history has been marked as reviewed and updated as appropriate. allergies, current medications, past medical history, past surgical history and problem list Past Medical History  Diagnosis Date  . Congenital deafness   . Hyperlipidemia   . Allergy     RHINITIS  . Atypical nevus 2007    dr Elliot Gurney  . Ganglion    Patient Active Problem List   Diagnosis Date Noted  . EXTERNAL HEMORRHOIDS 11/29/2007  . ALLERGIC RHINITIS 01/10/2007  . HYPERLIPIDEMIA 01/06/2007  . ANOMALY, CONGENITAL, EAR NOS W/HEARING IMPR 01/06/2007   Current Outpatient Prescriptions  Medication Sig Dispense Refill  . fluticasone (FLONASE) 50 MCG/ACT nasal spray Place 2 sprays into the nose daily.  16 g  3  . ibuprofen (ADVIL,MOTRIN) 600 MG tablet Take 600 mg by mouth every 6 (six) hours as needed for pain.      . Omega-3 Fatty Acids (FISH OIL TRIPLE STRENGTH) 1400 MG CAPS Take by mouth.        . rosuvastatin (CRESTOR) 10 MG tablet Take 1 tablet (10 mg total) by mouth daily.  90 tablet  1  . azithromycin (ZITHROMAX Z-PAK) 250 MG tablet 2 day 1, then 1 qd  6 each  0  . HYDROcodone-homatropine (HYDROMET) 5-1.5 MG/5ML syrup Take 5 mLs by mouth every 6 (six) hours as needed for cough.  120 mL  0   No current facility-administered medications for this visit.   Allergies: Erythromycin  Review of Systems Constitutional:  negative for anorexia, chills, fatigue, fevers, night sweats and weight loss Ears, nose, mouth, throat, and face: negative for sore throat Respiratory: negative for cough, pleurisy/chest pain and sputum Cardiovascular: negative for chest pain, chest pressure/discomfort, irregular heart beat, lower extremity edema and near-syncope Gastrointestinal: negative for abdominal pain, change in bowel habits, constipation, diarrhea, dyspepsia, nausea and vomiting Genitourinary:negative for decreased stream, dysuria, frequency and hematuria Musculoskeletal:positive for L flank pain     Objective:    BP 120/76  Pulse 71  Temp(Src) 98.2 F (36.8 C) (Oral)  Ht 5' 8.5" (1.74 m)  Wt 197 lb 3.2 oz (89.449 kg)  BMI 29.54 kg/m2  SpO2 95% General appearance: alert, cooperative, appears stated age and no distress Head: Normocephalic, without obvious abnormality, atraumatic Eyes: negative findings: lids and lashes normal, conjunctivae and sclerae normal and corneas clear Chest wall: L CVA tenderness Abdomen: abnormal findings:  tender L adb adjacent to umbilicus to deep palpation    Assessment:    Abdominal pain, likely secondary to kidney stone DD: muscle strain, diverticulitis, appendicitis, pancreatitis Urinalysis dip-trace ketones, trace protein, SG 1.030 o/w normal findings .    Plan:  Urine culture & micro pending Lipase, amylase, CBC pending HgA1C pending See pt instructions. F/u dependent on labs.

## 2013-04-17 NOTE — Patient Instructions (Addendum)
Your pain may be related to a kidney stone or muscle strain. A kidney stone may pass on its own-depends on the size. Sometimes they are too large to pass & surgical intervention is necessary. Continue to take 600 mg ibuprophen every 8 hours for pain if needed. Our office will call you with results of blood tests and follow up. In the meantime, if the pain worsens after office hours, please seek evaluation at emergency department. Pleasure to meet you.  Kidney Stones Kidney stones (ureteral lithiasis) are deposits that form inside your kidneys. The intense pain is caused by the stone moving through the urinary tract. When the stone moves, the ureter goes into spasm around the stone. The stone is usually passed in the urine.  CAUSES   A disorder that makes certain neck glands produce too much parathyroid hormone (primary hyperparathyroidism).  A buildup of uric acid crystals.  Narrowing (stricture) of the ureter.  A kidney obstruction present at birth (congenital obstruction).  Previous surgery on the kidney or ureters.  Numerous kidney infections. SYMPTOMS   Feeling sick to your stomach (nauseous).  Throwing up (vomiting).  Blood in the urine (hematuria).  Pain that usually spreads (radiates) to the groin.  Frequency or urgency of urination. DIAGNOSIS   Taking a history and physical exam.  Blood or urine tests.  Computerized X-ray scan (CT scan).  Occasionally, an examination of the inside of the urinary bladder (cystoscopy) is performed. TREATMENT   Observation.  Increasing your fluid intake.  Surgery may be needed if you have severe pain or persistent obstruction. The size, location, and chemical composition are all important variables that will determine the proper choice of action for you. Talk to your caregiver to better understand your situation so that you will minimize the risk of injury to yourself and your kidney.  HOME CARE INSTRUCTIONS   Drink enough water  and fluids to keep your urine clear or pale yellow.  Strain all urine through the provided strainer. Keep all particulate matter and stones for your caregiver to see. The stone causing the pain may be as small as a grain of salt. It is very important to use the strainer each and every time you pass your urine. The collection of your stone will allow your caregiver to analyze it and verify that a stone has actually passed.  Only take over-the-counter or prescription medicines for pain, discomfort, or fever as directed by your caregiver.  Make a follow-up appointment with your caregiver as directed.  Get follow-up X-rays if required. The absence of pain does not always mean that the stone has passed. It may have only stopped moving. If the urine remains completely obstructed, it can cause loss of kidney function or even complete destruction of the kidney. It is your responsibility to make sure X-rays and follow-ups are completed. Ultrasounds of the kidney can show blockages and the status of the kidney. Ultrasounds are not associated with any radiation and can be performed easily in a matter of minutes. SEEK IMMEDIATE MEDICAL CARE IF:   Pain cannot be controlled with the prescribed medicine.  You have a fever.  The severity or intensity of pain increases over 18 hours and is not relieved by pain medicine.  You develop a new onset of abdominal pain.  You feel faint or pass out. MAKE SURE YOU:   Understand these instructions.  Will watch your condition.  Will get help right away if you are not doing well or get worse. Document Released:  08/10/2005 Document Revised: 11/02/2011 Document Reviewed: 12/06/2009 ExitCare Patient Information 2014 [place], [place].

## 2013-04-18 LAB — CBC
MCV: 88.1 fl (ref 78.0–100.0)
RBC: 5.03 Mil/uL (ref 4.22–5.81)
WBC: 11.3 10*3/uL — ABNORMAL HIGH (ref 4.5–10.5)

## 2013-04-18 LAB — URINALYSIS, ROUTINE W REFLEX MICROSCOPIC
Bilirubin Urine: NEGATIVE
Total Protein, Urine: NEGATIVE
Urine Glucose: NEGATIVE
pH: 5.5 (ref 5.0–8.0)

## 2013-04-18 LAB — LIPASE: Lipase: 29 U/L (ref 11.0–59.0)

## 2013-04-19 LAB — URINE CULTURE
Colony Count: NO GROWTH
Organism ID, Bacteria: NO GROWTH

## 2013-04-20 ENCOUNTER — Telehealth: Payer: Self-pay | Admitting: Nurse Practitioner

## 2013-04-20 NOTE — Telephone Encounter (Signed)
Patient notified and scheduled a re-eval. 04/25/13 11:15am

## 2013-04-20 NOTE — Telephone Encounter (Signed)
Message copied by Maximino Sarin COX on Thu Apr 20, 2013 12:51 PM ------      Message from: Marlene Lard      Created: Thu Apr 20, 2013  8:50 AM       Patient notified of results. Patient stated that his symptoms are the same. ------

## 2013-04-20 NOTE — Telephone Encounter (Signed)
Rec re-eval early next week.

## 2013-04-25 ENCOUNTER — Ambulatory Visit: Payer: BC Managed Care – PPO | Admitting: Internal Medicine

## 2013-06-14 ENCOUNTER — Telehealth: Payer: Self-pay | Admitting: *Deleted

## 2013-06-14 DIAGNOSIS — Z0000 Encounter for general adult medical examination without abnormal findings: Secondary | ICD-10-CM

## 2013-06-14 NOTE — Telephone Encounter (Signed)
06/14/2013  Pt came by office, dropped off 2014 Health Provider Screening Form.  Form states exam and lab test must have occurred between 07/09/2012 and 07/08/2013.  Made lab appt for CPE labs to be drawn 06/20/2013 at 8:30am.  CPE scheduled for 07/11/2013 at 8am.    Can you please put the orders in for the labs that need to be drawn?  Thank you! Harrold Fitchett

## 2013-06-14 NOTE — Telephone Encounter (Signed)
CMP, CBC, TSH, PSA, FLP-- dx v70

## 2013-06-15 NOTE — Telephone Encounter (Signed)
Labs ordered. DJR  

## 2013-06-16 ENCOUNTER — Telehealth: Payer: Self-pay | Admitting: General Practice

## 2013-06-16 NOTE — Telephone Encounter (Signed)
noted 

## 2013-06-16 NOTE — Telephone Encounter (Signed)
Biometric screening papers dropped off to office on 06/14/13. Pt scheduled for CPE on 07/11/13. CPE labs will be completed on 06/20/13.    Papers given to Onalee Hua to hold onto until labs and CPE completed.

## 2013-06-17 ENCOUNTER — Other Ambulatory Visit: Payer: Self-pay | Admitting: Internal Medicine

## 2013-06-19 NOTE — Telephone Encounter (Signed)
rx refilled per protocol. DJR  

## 2013-06-20 ENCOUNTER — Encounter: Payer: Self-pay | Admitting: Internal Medicine

## 2013-06-20 ENCOUNTER — Ambulatory Visit (INDEPENDENT_AMBULATORY_CARE_PROVIDER_SITE_OTHER): Payer: BC Managed Care – PPO | Admitting: Internal Medicine

## 2013-06-20 ENCOUNTER — Other Ambulatory Visit (INDEPENDENT_AMBULATORY_CARE_PROVIDER_SITE_OTHER): Payer: BC Managed Care – PPO

## 2013-06-20 VITALS — BP 126/79 | HR 65 | Temp 98.1°F | Wt 198.2 lb

## 2013-06-20 DIAGNOSIS — Z0000 Encounter for general adult medical examination without abnormal findings: Secondary | ICD-10-CM

## 2013-06-20 DIAGNOSIS — J029 Acute pharyngitis, unspecified: Secondary | ICD-10-CM

## 2013-06-20 LAB — COMPREHENSIVE METABOLIC PANEL
Albumin: 4.5 g/dL (ref 3.5–5.2)
Alkaline Phosphatase: 70 U/L (ref 39–117)
Calcium: 9.9 mg/dL (ref 8.4–10.5)
Chloride: 100 mEq/L (ref 96–112)
Glucose, Bld: 109 mg/dL — ABNORMAL HIGH (ref 70–99)
Potassium: 4.2 mEq/L (ref 3.5–5.1)
Sodium: 140 mEq/L (ref 135–145)
Total Protein: 7.4 g/dL (ref 6.0–8.3)

## 2013-06-20 LAB — CBC
HCT: 47.5 % (ref 39.0–52.0)
MCV: 89.3 fl (ref 78.0–100.0)
RDW: 13.1 % (ref 11.5–14.6)
WBC: 11.9 10*3/uL — ABNORMAL HIGH (ref 4.5–10.5)

## 2013-06-20 LAB — HEPATIC FUNCTION PANEL
Albumin: 4.5 g/dL (ref 3.5–5.2)
Bilirubin, Direct: 0.1 mg/dL (ref 0.0–0.3)
Total Protein: 7.4 g/dL (ref 6.0–8.3)

## 2013-06-20 LAB — TSH: TSH: 0.97 u[IU]/mL (ref 0.35–5.50)

## 2013-06-20 MED ORDER — CEPHALEXIN 500 MG PO CAPS: 500.0000 mg | ORAL_CAPSULE | Freq: Two times a day (BID) | ORAL | Status: DC

## 2013-06-20 NOTE — Progress Notes (Signed)
  Subjective:    Patient ID: Brian Gibson, male    DOB: [birth year], 57 y.o.   MRN: [id number]  HPI   He developed a slight sore throat 06/19/13 which has progressed. No sick contacts.  It was worse overnight and impacted his swallowing  He's noted some lymph node swelling. Today he's had rhinitis with clear drainage.  Over-the-counter spray & saline gargles were of minimal benefit   Review of Systems  He also has no fever, chills, or sweats.  He specifically denies extrinsic symptoms of itchy, watery eyes, or sneezing  He has no frontal headache, facial pain, nasal purulence, otic pain, or otic discharge.  He also denies any cough, sputum production, shortness of breath, or wheezing.     Objective:   Physical Exam  General appearance:good health ;well nourished; no acute distress or increased work of breathing is present.  No  lymphadenopathy about the head, neck, or axilla noted but tende R submandibular area to palpation.   Eyes: No conjunctival inflammation or lid edema is present.   Ears:  External ear exam shows no significant lesions or deformities.  Otoscopic examination reveals clear canals, tympanic membranes are dull but intact bilaterally without bulging, retraction, inflammation or discharge.  Nose:  External nasal examination shows no deformity or inflammation. Nasal mucosa are pink and moist without lesions or exudates. No septal dislocation or deviation.No obstruction to airflow.   Oral exam: Dental hygiene is good; lips and gums are healthy appearing.There is mild oropharyngeal erythema. Noexudate noted.   Neck:  No deformities or masses, noted.   Supple with full range of motion without pain.   Heart:  Normal rate and regular rhythm. S1 and S2 normal without gallop, murmur,or  Click. S4  Lungs:Chest clear to auscultation; no wheezes, rhonchi,rales ,or rubs present.No increased work of breathing.    Extremities:  No cyanosis, edema, or clubbing  noted     Skin: Warm & dry         Assessment & Plan:  #1pharyngitis See orders

## 2013-06-20 NOTE — Patient Instructions (Signed)
Zicam Melts or Zinc lozenges ; vitamin C 2000 mg daily; & Echinacea for 4-7 days. Report fever, exudate("pus") or progressive pain. Plain Mucinex (NOT D) for thick secretions ;force NON dairy fluids .   Nasal cleansing in the shower as discussed with lather of mild shampoo.After 10 seconds wash off lather while  exhaling through nostrils. Make sure that all residual soap is removed to prevent irritation.  Nasacort AQ OTC 1 spray in each nostril twice a day as needed. Use the "crossover" technique into opposite nostril spraying toward opposite ear @ 45 degree angle, not straight up into nostril.  Use a Neti pot daily only  as needed for significant sinus congestion; going from open side to congested side . Plain Allegra (NOT D )  160 daily , Loratidine 10 mg , OR Zyrtec 10 mg @ bedtime  as needed for itchy eyes & sneezing.    

## 2013-06-21 LAB — LIPID PANEL
HDL: 60.3 mg/dL (ref >39.00)
Triglycerides: 132 mg/dL (ref 0.0–149.0)

## 2013-06-27 ENCOUNTER — Encounter: Payer: Self-pay | Admitting: Internal Medicine

## 2013-06-28 ENCOUNTER — Encounter: Payer: Self-pay | Admitting: *Deleted

## 2013-06-29 ENCOUNTER — Other Ambulatory Visit: Payer: Self-pay

## 2013-06-29 DIAGNOSIS — Z0279 Encounter for issue of other medical certificate: Secondary | ICD-10-CM

## 2013-07-10 ENCOUNTER — Telehealth: Payer: Self-pay

## 2013-07-10 NOTE — Telephone Encounter (Signed)
Left message for call back With answering service Medication List and allergies: reviewed and updated  90 day supply/mail order: na Local prescriptions: [facility] [address] and [place]  Immunizations due: declines flu vaccine at this time  A/P:   No changes to FH or [facility] Tdap--01/2013 CCs--07/06/2008--sessile polyp--Dr Gessner--next 2014  To Discuss with Provider: Not at this time Labs drawn 06/20/2013

## 2013-07-11 ENCOUNTER — Encounter: Payer: Self-pay | Admitting: Internal Medicine

## 2013-07-11 ENCOUNTER — Ambulatory Visit (INDEPENDENT_AMBULATORY_CARE_PROVIDER_SITE_OTHER): Payer: BC Managed Care – PPO | Admitting: Internal Medicine

## 2013-07-11 VITALS — BP 130/77 | HR 66 | Temp 98.6°F | Ht 68.5 in | Wt 201.0 lb

## 2013-07-11 DIAGNOSIS — R0602 Shortness of breath: Secondary | ICD-10-CM

## 2013-07-11 DIAGNOSIS — Z0000 Encounter for general adult medical examination without abnormal findings: Secondary | ICD-10-CM

## 2013-07-11 NOTE — Assessment & Plan Note (Addendum)
Td  01-2013 zostavax discussed  Offered a flu shot--declined Cscope 11-09---> 4 mm polyp, not recovered--- next 2014 per report, will contact GI  Recent  results discussed: Cholesterol well-controlled, PSA ok, A1c 5.9. WBC elevated Doing well w/ diet and physically active RTC 6 months for labs CBC A1C Other issues: GERD, change Zantac to Prilosec SOB--EKG normal, symptoms atypical, recommend observation but will call if symptoms increase  next visit 1 year

## 2013-07-11 NOTE — Patient Instructions (Signed)
Next visit in 1 year   for a physical exam. Please make an appointment    Instead of Zantac, try Prilosec 20 mg OTC one tablet before breakfast  Make an appointment in 6 months for blood work only, you don't need to be fasting: CBC (leukocytoses),  A1c ( hyperglycemia)

## 2013-07-11 NOTE — Progress Notes (Signed)
  Subjective:    Patient ID: Brian Gibson, male    DOB: 01/07/1956, 57 y.o.   MRN: [id number]  HPI CPX, here w/ a interpreter  Past Medical History  Diagnosis Date  . Congenital deafness   . Hyperlipidemia   . Allergy     RHINITIS  . Atypical nevus 2007    dr Elliot Gurney, saw him ~ 04-2013  . Ganglion    Past Surgical History  Procedure Laterality Date  . Appendectomy    . Vasectomy    . Rotator cuff repair  2009  . Tonsillectomy     History   Social History  . Marital Status: Married    Spouse Name: N/A    Number of Children: 2  . Years of Education: N/A   Occupational History  . COMPUTER PROGRAMMER    Social History Main Topics  . Smoking status: Current Some Day Smoker    Types: Cigarettes, Cigars  . Smokeless tobacco: Never Used     Comment: occasional cigar  . Alcohol Use: Yes     Comment: socially   . Drug Use: No  . Sexual Activity: Not on file   Other Topics Concern  . Not on file   Social History Narrative       Communicate w/ pt via mychart or  #  [phone] (RELAY PHONE)   Family History  Problem Relation Age of Onset  . Hypertension Mother   . Hypertension Father   . Cancer Neg Hx   . Diabetes Neg Hx   . Stroke Neg Hx   . CAD Neg Hx     Review of Systems Diet-- trying to eat healthy Exercise-- gym x 3/week For the last month reports "SOB" described as "can't take a deep clean breath", it may happen at rest, or while exercising, last a few seconds. Denies cough, wheezing, chest pain, palpitations, lower extremity edema. He also experiences heartburn from time to time, denies nausea, vomiting, diarrhea or blood in the stools. No dysuria, gross hematuria or difficulty urinating       Objective:   Physical Exam BP 130/77  Pulse 66  Temp(Src) 98.6 F (37 C)  Ht 5' 8.5" (1.74 m)  Wt 201 lb (91.173 kg)  BMI 30.11 kg/m2  SpO2 98% General -- alert, well-developed, NAD.  Neck --no thyromegaly , normal carotid pulse Lungs -- normal  respiratory effort, no intercostal retractions, no accessory muscle use, and normal breath sounds.  Heart-- normal rate, regular rhythm, no murmur.  Abdomen-- Not distended, good bowel sounds,soft, non-tender. Rectal-- No external abnormalities noted. Normal sphincter tone. No rectal masses or tenderness. Brown stool, Hemoccult negative  Prostate--Prostate gland firm and smooth, no enlargement, nodularity, tenderness, mass, asymmetry or induration. Extremities-- no pretibial edema bilaterally  Neurologic--  alert & oriented X3. Speech normal, gait normal, strength normal in all extremities.  Psych-- Cognition and judgment appear intact. Cooperative with normal attention span and concentration. No anxious appearing , no depressed appearing.     Assessment & Plan:

## 2013-07-11 NOTE — Progress Notes (Signed)
Pre visit review using our clinic review tool, if applicable. No additional management support is needed unless otherwise documented below in the visit note. 

## 2013-07-24 ENCOUNTER — Encounter: Payer: Self-pay | Admitting: Internal Medicine

## 2013-07-26 ENCOUNTER — Encounter: Payer: Self-pay | Admitting: Internal Medicine

## 2013-07-27 ENCOUNTER — Encounter: Payer: Self-pay | Admitting: Internal Medicine

## 2013-08-10 ENCOUNTER — Encounter: Payer: Self-pay | Admitting: Internal Medicine

## 2013-09-22 ENCOUNTER — Other Ambulatory Visit: Payer: Self-pay | Admitting: Internal Medicine

## 2013-09-28 ENCOUNTER — Ambulatory Visit (AMBULATORY_SURGERY_CENTER): Payer: Self-pay | Admitting: *Deleted

## 2013-09-28 VITALS — Ht 68.0 in | Wt 206.2 lb

## 2013-09-28 DIAGNOSIS — Z86010 Personal history of colonic polyps: Secondary | ICD-10-CM

## 2013-09-28 MED ORDER — NA SULFATE-K SULFATE-MG SULF 17.5-3.13-1.6 GM/177ML PO SOLN: 1.0000 | Freq: Once | ORAL | Status: DC

## 2013-09-28 NOTE — Progress Notes (Signed)
No allergies to eggs or soy. No problems with anesthesia.  Interpreter present for PV

## 2013-09-29 ENCOUNTER — Encounter: Payer: Self-pay | Admitting: Internal Medicine

## 2013-10-13 ENCOUNTER — Ambulatory Visit (AMBULATORY_SURGERY_CENTER): Payer: BC Managed Care – PPO | Admitting: Internal Medicine

## 2013-10-13 ENCOUNTER — Encounter: Payer: Self-pay | Admitting: Internal Medicine

## 2013-10-13 VITALS — BP 119/85 | HR 60 | Temp 97.7°F | Resp 25 | Ht 68.0 in | Wt 206.0 lb

## 2013-10-13 DIAGNOSIS — Z86010 Personal history of colonic polyps: Secondary | ICD-10-CM

## 2013-10-13 DIAGNOSIS — K5730 Diverticulosis of large intestine without perforation or abscess without bleeding: Secondary | ICD-10-CM

## 2013-10-13 MED ORDER — SODIUM CHLORIDE 0.9 % IV SOLN: 500.0000 mL | INTRAVENOUS | Status: DC

## 2013-10-13 NOTE — Patient Instructions (Addendum)
No polyps today! Next routine colonoscopy in 10 years - 2025  I appreciate the opportunity to care for you. Gatha Mayer, MD, FACG  YOU HAD AN ENDOSCOPIC PROCEDURE TODAY AT [facility]: Refer to the procedure report that was given to you for any specific questions about what was found during the examination.  If the procedure report does not answer your questions, please call your gastroenterologist to clarify.  If you requested that your care partner not be given the details of your procedure findings, then the procedure report has been included in a sealed envelope for you to review at your convenience later.  YOU SHOULD EXPECT: Some feelings of bloating in the abdomen. Passage of more gas than usual.  Walking can help get rid of the air that was put into your GI tract during the procedure and reduce the bloating. If you had a lower endoscopy (such as a colonoscopy or flexible sigmoidoscopy) you may notice spotting of blood in your stool or on the toilet paper. If you underwent a bowel prep for your procedure, then you may not have a normal bowel movement for a few days.  DIET: Your first meal following the procedure should be a light meal and then it is ok to progress to your normal diet.  A half-sandwich or bowl of soup is an example of a good first meal.  Heavy or fried foods are harder to digest and may make you feel nauseous or bloated.  Likewise meals heavy in dairy and vegetables can cause extra gas to form and this can also increase the bloating.  Drink plenty of fluids but you should avoid alcoholic beverages for 24 hours.  ACTIVITY: Your care partner should take you home directly after the procedure.  You should plan to take it easy, moving slowly for the rest of the day.  You can resume normal activity the day after the procedure however you should NOT DRIVE or use heavy machinery for 24 hours (because of the sedation medicines used during the test).    SYMPTOMS TO  REPORT IMMEDIATELY: A gastroenterologist can be reached at any hour.  During normal business hours, 8:30 AM to 5:00 PM Monday through Friday, call [phone].  After hours and on weekends, please call the GI answering service at [phone] who will take a message and have the physician on call contact you.   Following lower endoscopy (colonoscopy or flexible sigmoidoscopy):  Excessive amounts of blood in the stool  Significant tenderness or worsening of abdominal pains  Swelling of the abdomen that is new, acute  Fever of 100F or higher  FOLLOW UP: If any biopsies were taken you will be contacted by phone or by letter within the next 1-3 weeks.  Call your gastroenterologist if you have not heard about the biopsies in 3 weeks.  Our staff will call the home number listed on your records the next business day following your procedure to check on you and address any questions or concerns that you may have at that time regarding the information given to you following your procedure. This is a courtesy call and so if there is no answer at the home number and we have not heard from you through the emergency physician on call, we will assume that you have returned to your regular daily activities without incident.  SIGNATURES/CONFIDENTIALITY: You and/or your care partner have signed paperwork which will be entered into your electronic medical record.  These signatures attest to  the fact that that the information above on your After Visit Summary has been reviewed and is understood.  Full responsibility of the confidentiality of this discharge information lies with you and/or your care-partner.  Recommendations Repeat colonoscopy in 10 years

## 2013-10-13 NOTE — Progress Notes (Signed)
Procedure ends, to recovery, report given and VSS. 

## 2013-10-13 NOTE — Op Note (Signed)
[facility]  [address] [place], [id number]   COLONOSCOPY PROCEDURE REPORT  PATIENT: Brian Gibson, Brian Gibson  MR#: [id number] BIRTHDATE: April 20, 1956 , 89  yrs. old GENDER: Male ENDOSCOPIST: Gatha Mayer, MD, [facility] PROCEDURE DATE:  10/13/2013 PROCEDURE:   Colonoscopy, surveillance First Screening Colonoscopy - Avg.  risk and is 50 yrs.  old or older - No.  Prior Negative Screening - Now for repeat screening. N/A  History of Adenoma - Now for follow-up colonoscopy & has been > or = to 3 yrs.  Yes hx of adenoma.  Has been 3 or more years since last colonoscopy.  Polyps Removed Today? No.  Recommend repeat exam, <10 yrs? No. ASA CLASS:   Class II INDICATIONS:Patient's personal history of colon polyps. MEDICATIONS: Propofol (Diprivan) 310 mg IV, MAC sedation, administered by CRNA, and These medications were titrated to patient response per physician's verbal order  DESCRIPTION OF PROCEDURE:   After the risks benefits and alternatives of the procedure were thoroughly explained, informed consent was obtained.  A digital rectal exam revealed no abnormalities of the rectum.   The [id number]  endoscope was introduced through the anus and advanced to the cecum, which was identified by both the appendix and ileocecal valve. No adverse events experienced.   The quality of the prep was Suprep good  The instrument was then slowly withdrawn as the colon was fully examined.      COLON FINDINGS: Severe diverticulosis was noted in the sigmoid colon.   The colon mucosa was otherwise normal.  Retroflexed views revealed no abnormalities. The time to cecum=3 minutes 33 seconds. Withdrawal time=9 minutes 46 seconds.  The scope was withdrawn and the procedure completed. COMPLICATIONS: There were no complications.  ENDOSCOPIC IMPRESSION: 1.   Severe diverticulosis was noted in the sigmoid colon 2.   The colon mucosa was otherwise normal - good prep  RECOMMENDATIONS: 1.   Repeat colonoscopy 10 years. 2.   Prior diminutive polyp removed, not recvered 2009   eSigned:  Gatha Mayer, MD, [facility] 10/13/2013 2:04 PM   cc:   PATIENT NAME:  Bravlio, Luca MR#: [id number]

## 2013-10-16 ENCOUNTER — Telehealth: Payer: Self-pay | Admitting: *Deleted

## 2013-10-16 NOTE — Telephone Encounter (Signed)
  Follow up Call-  Call back number 10/13/2013  Post procedure Call Back phone  # [phone]- takes a while for some one to pick interperter will up.  Permission to leave phone message Yes    [phone]- interpretor number- message left per interpretor

## 2013-12-07 ENCOUNTER — Other Ambulatory Visit: Payer: Self-pay | Admitting: *Deleted

## 2013-12-07 ENCOUNTER — Encounter: Payer: Self-pay | Admitting: Internal Medicine

## 2013-12-07 MED ORDER — FLUTICASONE PROPIONATE 50 MCG/ACT NA SUSP: 2.0000 | Freq: Every day | NASAL | Status: DC

## 2014-01-09 ENCOUNTER — Telehealth: Payer: Self-pay | Admitting: Internal Medicine

## 2014-01-09 DIAGNOSIS — R739 Hyperglycemia, unspecified: Secondary | ICD-10-CM

## 2014-01-09 DIAGNOSIS — D72829 Elevated white blood cell count, unspecified: Secondary | ICD-10-CM

## 2014-01-09 NOTE — Telephone Encounter (Signed)
Advise patient, needs labs, no need for office visit. CBC ---leukocytosis A1c-- Hyperglycemia Please arrange

## 2014-01-10 NOTE — Telephone Encounter (Signed)
Information given through tele prompt operator. Patient agrees with plan, orders entered, appt scheduled

## 2014-01-17 ENCOUNTER — Other Ambulatory Visit (INDEPENDENT_AMBULATORY_CARE_PROVIDER_SITE_OTHER): Payer: BC Managed Care – PPO

## 2014-01-17 DIAGNOSIS — D72829 Elevated white blood cell count, unspecified: Secondary | ICD-10-CM

## 2014-01-17 DIAGNOSIS — R739 Hyperglycemia, unspecified: Secondary | ICD-10-CM

## 2014-01-17 DIAGNOSIS — R7309 Other abnormal glucose: Secondary | ICD-10-CM

## 2014-01-17 NOTE — Addendum Note (Signed)
Addended by: Modena Morrow D on: 01/17/2014 12:00 PM   Modules accepted: Orders

## 2014-01-18 LAB — CBC WITH DIFFERENTIAL/PLATELET
BASOS ABS: 0 10*3/uL (ref 0.0–0.1)
BASOS PCT: 0.3 % (ref 0.0–3.0)
EOS PCT: 5.6 % — AB (ref 0.0–5.0)
Eosinophils Absolute: 0.3 10*3/uL (ref 0.0–0.7)
HCT: 44.5 % (ref 39.0–52.0)
HEMOGLOBIN: 15.1 g/dL (ref 13.0–17.0)
LYMPHS PCT: 25.9 % (ref 12.0–46.0)
Lymphs Abs: 1.6 10*3/uL (ref 0.7–4.0)
MCHC: 33.9 g/dL (ref 30.0–36.0)
MCV: 89.4 fl (ref 78.0–100.0)
MONOS PCT: 8.1 % (ref 3.0–12.0)
Monocytes Absolute: 0.5 10*3/uL (ref 0.1–1.0)
NEUTROS ABS: 3.6 10*3/uL (ref 1.4–7.7)
Neutrophils Relative %: 60.1 % (ref 43.0–77.0)
Platelets: 249 10*3/uL (ref 150.0–400.0)
RBC: 4.98 Mil/uL (ref 4.22–5.81)
RDW: 13.4 % (ref 11.5–15.5)
WBC: 6 10*3/uL (ref 4.0–10.5)

## 2014-01-18 LAB — HEMOGLOBIN A1C: Hgb A1c MFr Bld: 5.9 % (ref 4.6–6.5)

## 2014-05-03 ENCOUNTER — Encounter: Payer: Self-pay | Admitting: Internal Medicine

## 2014-06-11 ENCOUNTER — Other Ambulatory Visit: Payer: Self-pay | Admitting: Internal Medicine

## 2014-06-12 ENCOUNTER — Other Ambulatory Visit: Payer: Self-pay

## 2014-06-12 MED ORDER — ROSUVASTATIN CALCIUM 10 MG PO TABS: ORAL_TABLET | ORAL | Status: DC

## 2014-06-14 ENCOUNTER — Encounter: Payer: Self-pay | Admitting: Internal Medicine

## 2014-06-20 ENCOUNTER — Encounter: Payer: Self-pay | Admitting: Internal Medicine

## 2014-06-21 ENCOUNTER — Other Ambulatory Visit: Payer: Self-pay

## 2014-06-21 MED ORDER — TADALAFIL 20 MG PO TABS: ORAL_TABLET | ORAL | Status: DC

## 2014-06-22 ENCOUNTER — Other Ambulatory Visit: Payer: Self-pay

## 2014-06-22 MED ORDER — TADALAFIL 20 MG PO TABS: ORAL_TABLET | ORAL | Status: DC

## 2014-07-25 ENCOUNTER — Ambulatory Visit (INDEPENDENT_AMBULATORY_CARE_PROVIDER_SITE_OTHER): Payer: BC Managed Care – PPO | Admitting: Physician Assistant

## 2014-07-25 ENCOUNTER — Encounter: Payer: Self-pay | Admitting: Physician Assistant

## 2014-07-25 VITALS — BP 134/88 | HR 76 | Temp 98.5°F | Resp 16 | Ht 68.0 in | Wt 204.5 lb

## 2014-07-25 DIAGNOSIS — J208 Acute bronchitis due to other specified organisms: Principal | ICD-10-CM

## 2014-07-25 DIAGNOSIS — B9689 Other specified bacterial agents as the cause of diseases classified elsewhere: Secondary | ICD-10-CM

## 2014-07-25 DIAGNOSIS — J00 Acute nasopharyngitis [common cold]: Secondary | ICD-10-CM

## 2014-07-25 MED ORDER — AZITHROMYCIN 250 MG PO TABS: ORAL_TABLET | ORAL | Status: DC

## 2014-07-25 MED ORDER — BENZONATATE 100 MG PO CAPS: 100.0000 mg | ORAL_CAPSULE | Freq: Two times a day (BID) | ORAL | Status: DC | PRN

## 2014-07-25 NOTE — Assessment & Plan Note (Signed)
Rx Azithromycin. Increase fluids.  Rest.  Saline nasal spray. Mucinex-DM as directed.  Humidifier in bedroom.  Return precautions discussed with patient.

## 2014-07-25 NOTE — Patient Instructions (Signed)
Please take antibiotic and Tessalon Perles (for cough) as directed.  Stay well hydrated and get plenty of rest.  You can use mucinex-DM to help with your congestion. Place a humidifier in the bedroom.  Call or return to clinic if symptoms are not improving.

## 2014-07-25 NOTE — Progress Notes (Signed)
Patient presents to clinic today c/o 1.5 weeks of worsening cough, chest congestion and fatigue.  Cough is now productive of yellow sputum.  Denies fever, chills, SOB or pleuritic chest pain.  Denies recent travel or sick contact.  Past Medical History  Diagnosis Date  . Congenital deafness   . Hyperlipidemia   . Allergy     RHINITIS  . Atypical nevus 2007    dr Gretta Cool, saw him ~ 04-2013  . Ganglion     Current Outpatient Prescriptions on File Prior to Visit  Medication Sig Dispense Refill  . diphenhydrAMINE (BENADRYL) 50 MG capsule Take 50 mg by mouth at bedtime.    . fluticasone (FLONASE) 50 MCG/ACT nasal spray Place 2 sprays into both nostrils daily. 16 g 2  . Omega-3 Fatty Acids (FISH OIL TRIPLE STRENGTH) 1400 MG CAPS Take by mouth.      . rosuvastatin (CRESTOR) 10 MG tablet Take 1 tablet daily. DUE FOR APPT WITH DR PAZ. NO FURTHER REFILLS. [id number] 90 tablet 2  . tadalafil (CIALIS) 20 MG tablet Take 1/2 to 1 tablet by mouth as needed. 10 tablet 1   No current facility-administered medications on file prior to visit.    Allergies  Allergen Reactions  . Erythromycin Nausea And Vomiting    Family History  Problem Relation Age of Onset  . Hypertension Mother   . Hypertension Father   . Cancer Neg Hx   . Diabetes Neg Hx   . Stroke Neg Hx   . CAD Neg Hx   . Colon cancer Neg Hx   . Cardiomyopathy Sister     hyperthrophic     History   Social History  . Marital Status: Married    Spouse Name: N/A    Number of Children: 2  . Years of Education: N/A   Occupational History  . COMPUTER PROGRAMMER    Social History Main Topics  . Smoking status: Current Some Day Smoker    Types: Cigarettes, Cigars  . Smokeless tobacco: Never Used     Comment: occasional cigar  . Alcohol Use: 8.4 oz/week    14 Glasses of wine per week     Comment: socially   . Drug Use: No  . Sexual Activity: None   Other Topics Concern  . None   Social History Narrative       Communicate  w/ pt via mychart or  #  [phone] (RELAY PHONE)   Review of Systems - See HPI.  All other ROS are negative.  BP 134/88 mmHg  Pulse 76  Temp(Src) 98.5 F (36.9 C) (Oral)  Resp 16  Ht 5\' 8"  (1.727 m)  Wt 204 lb 8 oz (92.761 kg)  BMI 31.10 kg/m2  SpO2 97%  Physical Exam  Constitutional: He is oriented to person, place, and time and well-developed, well-nourished, and in no distress.  HENT:  Head: Normocephalic and atraumatic.  Right Ear: External ear normal.  Left Ear: External ear normal.  Nose: Nose normal.  Mouth/Throat: Oropharynx is clear and moist. No oropharyngeal exudate.  TM within normal limits bilaterally.  Eyes: Conjunctivae are normal. Pupils are equal, round, and reactive to light.  Neck: Neck supple.  Cardiovascular: Normal rate, regular rhythm, normal heart sounds and intact distal pulses.   Pulmonary/Chest: Effort normal and breath sounds normal. No respiratory distress. He has no wheezes. He has no rales. He exhibits no tenderness.  Lymphadenopathy:    He has no cervical adenopathy.  Neurological: He is alert and  oriented to person, place, and time.  Skin: Skin is warm and dry. No rash noted.  Psychiatric: Affect normal.  Vitals reviewed.  Assessment/Plan: Acute bacterial bronchitis Rx Azithromycin. Increase fluids.  Rest.  Saline nasal spray. Mucinex-DM as directed.  Humidifier in bedroom.  Return precautions discussed with patient.

## 2014-07-25 NOTE — Progress Notes (Signed)
Pre visit review using our clinic review tool, if applicable. No additional management support is needed unless otherwise documented below in the visit note/SLS  

## 2014-08-01 ENCOUNTER — Encounter: Payer: Self-pay | Admitting: Internal Medicine

## 2014-08-01 ENCOUNTER — Ambulatory Visit (INDEPENDENT_AMBULATORY_CARE_PROVIDER_SITE_OTHER): Payer: BC Managed Care – PPO | Admitting: Internal Medicine

## 2014-08-01 VITALS — BP 138/78 | HR 69 | Temp 98.1°F | Ht 68.0 in | Wt 206.5 lb

## 2014-08-01 DIAGNOSIS — Z8249 Family history of ischemic heart disease and other diseases of the circulatory system: Secondary | ICD-10-CM

## 2014-08-01 DIAGNOSIS — R739 Hyperglycemia, unspecified: Secondary | ICD-10-CM

## 2014-08-01 DIAGNOSIS — E785 Hyperlipidemia, unspecified: Secondary | ICD-10-CM | POA: Diagnosis not present

## 2014-08-01 DIAGNOSIS — Z0000 Encounter for general adult medical examination without abnormal findings: Secondary | ICD-10-CM

## 2014-08-01 LAB — LIPID PANEL
CHOLESTEROL: 192 mg/dL (ref 0–200)
HDL: 57.5 mg/dL (ref >39.00)
LDL Cholesterol: 119 mg/dL — ABNORMAL HIGH (ref 0–99)
NONHDL: 134.5
Total CHOL/HDL Ratio: 3
Triglycerides: 80 mg/dL (ref 0.0–149.0)
VLDL: 16 mg/dL (ref 0.0–40.0)

## 2014-08-01 LAB — BASIC METABOLIC PANEL
BUN: 19 mg/dL (ref 6–23)
CALCIUM: 9.1 mg/dL (ref 8.4–10.5)
CO2: 25 mEq/L (ref 19–32)
CREATININE: 1.1 mg/dL (ref 0.4–1.5)
Chloride: 102 mEq/L (ref 96–112)
GFR: 76.65 mL/min (ref >60.00)
Glucose, Bld: 121 mg/dL — ABNORMAL HIGH (ref 70–99)
Potassium: 4.2 mEq/L (ref 3.5–5.1)
SODIUM: 136 meq/L (ref 135–145)

## 2014-08-01 LAB — ALT: ALT: 47 U/L (ref 0–53)

## 2014-08-01 LAB — AST: AST: 33 U/L (ref 0–37)

## 2014-08-01 LAB — PSA: PSA: 1.61 ng/mL (ref 0.10–4.00)

## 2014-08-01 LAB — HEMOGLOBIN A1C: HEMOGLOBIN A1C: 6.3 % (ref 4.6–6.5)

## 2014-08-01 MED ORDER — ROSUVASTATIN CALCIUM 10 MG PO TABS: ORAL_TABLET | ORAL | Status: DC

## 2014-08-01 MED ORDER — FLUTICASONE PROPIONATE 50 MCG/ACT NA SUSP: 2.0000 | Freq: Every day | NASAL | Status: DC

## 2014-08-01 NOTE — Progress Notes (Signed)
Subjective:    Patient ID: Brian Gibson, male    DOB: [birth year], 58 y.o.   MRN: [id number]  DOS:  08/01/2014 Type of visit - description : cpx, here w/ a interpreter Interval history: Doing well , needs Rfs  ROS Denies chest pain or difficulty breathing No nausea, vomiting, diarrhea. No blood in the stools GERD symptoms well controlled No anxiety or depression Was recently seen with bronchitis, doing better. Concerned about his family history of hypertrophic cardiomyopathy  Past Medical History  Diagnosis Date  . Congenital deafness   . Hyperlipidemia   . Allergy     RHINITIS  . Atypical nevus 2007    dr Gretta Cool, saw him ~ 04-2013  . Ganglion     Past Surgical History  Procedure Laterality Date  . Appendectomy    . Vasectomy    . Rotator cuff repair Bilateral 2009  . Tonsillectomy  1961  . Ganglion cyst excision Right     2 times    History   Social History  . Marital Status: Married    Spouse Name: N/A    Number of Children: 2  . Years of Education: N/A   Occupational History  . COMPUTER PROGRAMMER    Social History Main Topics  . Smoking status: Current Some Day Smoker    Types: Cigarettes, Cigars  . Smokeless tobacco: Never Used     Comment: occasional cigar  . Alcohol Use: 8.4 oz/week    14 Glasses of wine per week     Comment: socially   . Drug Use: No  . Sexual Activity: Not on file   Other Topics Concern  . Not on file   Social History Narrative    Communicate w/ pt via mychart or  #  [phone] (RELAY PHONE)     Family History  Problem Relation Age of Onset  . Hypertension Mother   . Hypertension Father   . Cancer Neg Hx   . Diabetes Neg Hx   . Stroke Neg Hx   . CAD Neg Hx   . Colon cancer Neg Hx   . Cardiomyopathy Sister     hyperthrophic        Medication List       This list is accurate as of: 08/01/14  5:47 PM.  Always use your most recent med list.               benzonatate 100 MG capsule  Commonly known as:   TESSALON  Take 1 capsule (100 mg total) by mouth 2 (two) times daily as needed for cough.     FISH OIL TRIPLE STRENGTH 1400 MG Caps  Take by mouth.     fluticasone 50 MCG/ACT nasal spray  Commonly known as:  FLONASE  Place 2 sprays into both nostrils daily.     rosuvastatin 10 MG tablet  Commonly known as:  CRESTOR  Take 1 tablet daily.     tadalafil 20 MG tablet  Commonly known as:  CIALIS  Take 1/2 to 1 tablet by mouth as needed.           Objective:   Physical Exam BP 138/78 mmHg  Pulse 69  Temp(Src) 98.1 F (36.7 C) (Oral)  Ht 5\' 8"  (1.727 m)  Wt 206 lb 8 oz (93.668 kg)  BMI 31.41 kg/m2  SpO2 96%  General -- alert, well-developed, NAD.  Neck --no thyromegaly , normal carotid pulse  HEENT-- Not pale.  Lungs -- normal respiratory effort,  no intercostal retractions, no accessory muscle use, and normal breath sounds.  Heart-- normal rate, regular rhythm, no murmur.  Abdomen-- Not distended, good bowel sounds,soft, non-tender. No rebound or rigidity. Rectal-- No external abnormalities noted. Normal sphincter tone. No rectal masses or tenderness. No stools found  Prostate--Prostate gland firm and smooth, no enlargement, nodularity, tenderness, mass, asymmetry or induration. Extremities-- no pretibial edema bilaterally  Neurologic--  alert & oriented X3. Speech normal, gait appropriate for age, strength symmetric and appropriate for age.  Psych-- Cognition and judgment appear intact. Cooperative with normal attention span and concentration. No anxious or depressed appearing.     Assessment & Plan:

## 2014-08-01 NOTE — Patient Instructions (Signed)
Get your blood work before you leave   Exercise on average 3 hours a week, a brisk walk is a great exercise   If you need more information about a healthy diet  visit  the American [employer] Association, it  is a Financial planner at:  [email or web address]-flynn.[email or web address]/  Will schedule echocardiogram to check your heart  Please come back to the office in 1 year for a physical exam. Come back fasting

## 2014-08-01 NOTE — Assessment & Plan Note (Addendum)
Td  01-2013 zostavax discussed , will wait till 58 y/o  Offered a flu shot--declined  Cscope 11-09---> 4 mm polyp, not recovered- Cscope again 2-15, tics, no polyps, 10 years  Labs  diet and physically active-- needs improvement , counseled    Other issues: GERD,asx as long as he takes a OTC  FH of hyperthrophic cardiomyopathy-- pt is asx,get echo BP slt elevated, rechecked-- 138/78 RF meds  next visit 1 year

## 2014-08-01 NOTE — Progress Notes (Signed)
Pre visit review using our clinic review tool, if applicable. No additional management support is needed unless otherwise documented below in the visit note. 

## 2014-08-02 ENCOUNTER — Encounter: Payer: Self-pay | Admitting: Internal Medicine

## 2014-08-03 ENCOUNTER — Encounter: Payer: BC Managed Care – PPO | Admitting: Internal Medicine

## 2014-08-22 ENCOUNTER — Ambulatory Visit (HOSPITAL_BASED_OUTPATIENT_CLINIC_OR_DEPARTMENT_OTHER)
Admission: RE | Admit: 2014-08-22 | Discharge: 2014-08-22 | Disposition: A | Discharge status: Home / Self Care | Payer: BC Managed Care – PPO | Source: Ambulatory Visit | Attending: Internal Medicine | Admitting: Internal Medicine

## 2014-08-22 DIAGNOSIS — I340 Nonrheumatic mitral (valve) insufficiency: Secondary | ICD-10-CM | POA: Diagnosis not present

## 2014-08-22 DIAGNOSIS — I371 Nonrheumatic pulmonary valve insufficiency: Secondary | ICD-10-CM | POA: Diagnosis not present

## 2014-08-22 DIAGNOSIS — Z136 Encounter for screening for cardiovascular disorders: Secondary | ICD-10-CM | POA: Diagnosis not present

## 2014-08-22 DIAGNOSIS — H905 Unspecified sensorineural hearing loss: Secondary | ICD-10-CM | POA: Insufficient documentation

## 2014-08-22 DIAGNOSIS — Z720 Tobacco use: Secondary | ICD-10-CM | POA: Insufficient documentation

## 2014-08-22 DIAGNOSIS — I517 Cardiomegaly: Secondary | ICD-10-CM

## 2014-08-22 DIAGNOSIS — Z8249 Family history of ischemic heart disease and other diseases of the circulatory system: Secondary | ICD-10-CM | POA: Diagnosis present

## 2014-08-22 DIAGNOSIS — E785 Hyperlipidemia, unspecified: Secondary | ICD-10-CM | POA: Diagnosis not present

## 2014-08-22 NOTE — Progress Notes (Signed)
  Echocardiogram 2D Echocardiogram has been performed.  Brian Gibson 08/22/2014, 9:24 AM

## 2014-08-28 ENCOUNTER — Other Ambulatory Visit: Payer: Self-pay

## 2014-08-28 ENCOUNTER — Ambulatory Visit (HOSPITAL_BASED_OUTPATIENT_CLINIC_OR_DEPARTMENT_OTHER)
Admission: RE | Admit: 2014-08-28 | Discharge: 2014-08-28 | Disposition: A | Discharge status: Home / Self Care | Payer: BLUE CROSS/BLUE SHIELD | Source: Ambulatory Visit | Attending: Internal Medicine | Admitting: Internal Medicine

## 2014-08-28 ENCOUNTER — Telehealth: Payer: Self-pay

## 2014-08-28 ENCOUNTER — Encounter: Payer: Self-pay | Admitting: Internal Medicine

## 2014-08-28 DIAGNOSIS — R05 Cough: Secondary | ICD-10-CM | POA: Insufficient documentation

## 2014-08-28 DIAGNOSIS — J209 Acute bronchitis, unspecified: Secondary | ICD-10-CM

## 2014-08-28 DIAGNOSIS — J984 Other disorders of lung: Secondary | ICD-10-CM | POA: Insufficient documentation

## 2014-08-28 MED ORDER — DOXYCYCLINE HYCLATE 100 MG PO TABS: 100.0000 mg | ORAL_TABLET | Freq: Two times a day (BID) | ORAL | Status: DC

## 2014-08-28 NOTE — Telephone Encounter (Signed)
-----   Message from Colon Branch, MD sent at 08/28/2014 11:44 AM EST ----- Enter a order for a chest x-ray, DX bronchitis to be done today or tomorrow Doxycycline 100 mg one by mouth twice a day #14 no refills

## 2014-08-28 NOTE — Telephone Encounter (Signed)
Chest x-ray ordered, and doxycycline 100mg  1 tablet twice daily # 14 0 RF sent to [facility].

## 2014-10-30 ENCOUNTER — Telehealth: Payer: Self-pay | Admitting: Internal Medicine

## 2014-10-30 NOTE — Telephone Encounter (Signed)
Tried calling Pt, rings once and get automated response "unable to reach party, please try again later."

## 2014-10-30 NOTE — Telephone Encounter (Signed)
Insurance is requesting change Crestor to atorvastatin, okay with me if the patient agrees. We could change to atorvastatin 40 mg #90 and 1 refills. If you decide to switch, check FLP, AST, ALT in 2 months

## 2014-11-01 NOTE — Telephone Encounter (Signed)
Letter printed and mailed to Pt informing him to contact us regarding his Crestor medication.

## 2014-11-05 ENCOUNTER — Other Ambulatory Visit: Payer: Self-pay

## 2014-11-05 DIAGNOSIS — E785 Hyperlipidemia, unspecified: Secondary | ICD-10-CM

## 2014-11-05 MED ORDER — ATORVASTATIN CALCIUM 40 MG PO TABS: 40.0000 mg | ORAL_TABLET | Freq: Every day | ORAL | Status: DC

## 2014-11-05 NOTE — Telephone Encounter (Signed)
Lipitor sent to [facility] on [address].

## 2014-11-05 NOTE — Telephone Encounter (Signed)
Pt returned your call, states to please contact him back using the pt portal mychart. States that's the best way to communicate with him

## 2014-11-05 NOTE — Telephone Encounter (Signed)
   Ola, I have sent a refill for Lipitor to [facility] on [address] for you. Please don't hesitate to let us know if you start having side effects.   KF ===View-only below this line===   ----- Message -----    From: Wenda Overland    Sent: 11/05/2014 11:24 AM EDT      To: CMA Elmina Hendel C F Subject: [id number]  I had Lipitor awhile back and had to discontinued it when my right arm was sore. ( i think one of the side effects ) but I can try it again .Marland Kitchen   Go ahead with the Lipitor but with reserverations.

## 2014-11-05 NOTE — Telephone Encounter (Signed)
MyChart message sent to Pt regarding Crestor and Atorvastatin.

## 2014-11-19 ENCOUNTER — Other Ambulatory Visit: Payer: Self-pay

## 2014-11-19 ENCOUNTER — Encounter: Payer: Self-pay | Admitting: Internal Medicine

## 2014-11-20 ENCOUNTER — Ambulatory Visit (INDEPENDENT_AMBULATORY_CARE_PROVIDER_SITE_OTHER): Payer: BLUE CROSS/BLUE SHIELD | Admitting: Internal Medicine

## 2014-11-20 ENCOUNTER — Telehealth: Payer: Self-pay | Admitting: Internal Medicine

## 2014-11-20 ENCOUNTER — Encounter: Payer: Self-pay | Admitting: Internal Medicine

## 2014-11-20 VITALS — BP 126/78 | HR 58 | Temp 97.6°F | Wt 196.1 lb

## 2014-11-20 DIAGNOSIS — R319 Hematuria, unspecified: Secondary | ICD-10-CM | POA: Diagnosis not present

## 2014-11-20 DIAGNOSIS — R310 Gross hematuria: Secondary | ICD-10-CM | POA: Diagnosis not present

## 2014-11-20 LAB — POCT URINALYSIS DIPSTICK
Bilirubin, UA: NEGATIVE
Blood, UA: NEGATIVE
GLUCOSE UA: NEGATIVE
Ketones, UA: NEGATIVE
Leukocytes, UA: NEGATIVE
Nitrite, UA: NEGATIVE
PROTEIN UA: NEGATIVE
Spec Grav, UA: >=1.03
UROBILINOGEN UA: 0.2
pH, UA: 5.5

## 2014-11-20 NOTE — Progress Notes (Signed)
Subjective:    Patient ID: Brian Gibson, male    DOB: Nov 09, 1955, 59 y.o.   MRN: [id number]  DOS:  11/20/2014  Type of visit - description : acute visit Interval history: Brian Gibson presented to the clinic with a three day period of gross hematuria. He spent five days hiking 70 miles on the [place] trail, and on the third day he experienced a few episodes of gross hematuria throughout the day which had resolved by that evening. This scenario of gross hematuria during the day and resolution of symptoms by the evening occurred on the next two days of his hike, Friday and Saturday, as well. Hematuria was accompanied by feelings of urinary urgency and inability to produce a full stream. Symptoms disappeared upon completion of the hike. The patient denies any dysuria or flank pain.  Patient is hearing impaired and an ASL interpreter mediated conversation.  Review of Systems  GENERAL: No fever, chills CV: No  CP, SOB GI: Denies  nausea, vomiting diarrhea, blood in the stools No abdominal pain GU: As per HPI  NEURO: No headaches, denies diplopia, denies dizziness       Past Medical History  Diagnosis Date  . Congenital deafness   . Hyperlipidemia   . Allergy     RHINITIS  . Atypical nevus 2007    dr Brian Gibson, saw him ~ 04-2013  . Ganglion     Past Surgical History  Procedure Laterality Date  . Appendectomy    . Vasectomy    . Rotator cuff repair Bilateral 2009  . Tonsillectomy  1961  . Ganglion cyst excision Right     2 times    History   Social History  . Marital Status: Married    Spouse Name: N/A  . Number of Children: 2  . Years of Education: N/A   Occupational History  . COMPUTER PROGRAMMER    Social History Main Topics  . Smoking status: Current Some Day Smoker    Types: Cigarettes, Cigars  . Smokeless tobacco: Never Used     Comment: occasional cigar  . Alcohol Use: 8.4 oz/week    14 Glasses of wine per week     Comment: socially   . Drug Use:  No  . Sexual Activity: Not on file   Other Topics Concern  . Not on file   Social History Narrative    Communicate w/ pt via mychart or  #  [phone] (RELAY PHONE)        Medication List       This list is accurate as of: 11/20/14  3:54 PM.  Always use your most recent med list.               atorvastatin 40 MG tablet  Commonly known as:  LIPITOR  Take 1 tablet (40 mg total) by mouth daily.     FISH OIL TRIPLE STRENGTH 1400 MG Caps  Take by mouth.     fluticasone 50 MCG/ACT nasal spray  Commonly known as:  FLONASE  Place 2 sprays into both nostrils daily.     tadalafil 20 MG tablet  Commonly known as:  CIALIS  Take 1/2 to 1 tablet by mouth as needed.           Objective:   Physical Exam BP 126/78 mmHg  Pulse 58  Temp(Src) 97.6 F (36.4 C) (Oral)  Wt 196 lb 2 oz (88.962 kg)  SpO2 97%  General:   Well developed, well nourished .  NAD.  HEENT:  Normocephalic . Face symmetric, atraumatic Lungs:  CTA B Normal respiratory effort, no intercostal retractions, no accessory muscle use. Heart: RRR,  no murmur.  Abdomen:  Not distended, soft, non-tender. No rebound or rigidity. No mass,organomegaly No CVA tenderness GU: DRE revealed soft, smooth, nontender prostate. Rectal wall exam was unremarkable. Skin: Not pale. Not jaundice Neurologic:  alert & oriented X3.  Speech normal, gait appropriate for age and unassisted Psych--  Cognition and judgment appear intact.  Cooperative with normal attention span and concentration.  Behavior appropriate. No anxious or depressed appearing.     Assessment & Plan:

## 2014-11-20 NOTE — Patient Instructions (Signed)
Get your blood work before you leave  Will refer you to a urologist

## 2014-11-20 NOTE — Telephone Encounter (Signed)
emmi emailed °

## 2014-11-20 NOTE — Progress Notes (Signed)
Pre visit review using our clinic review tool, if applicable. No additional management support is needed unless otherwise documented below in the visit note. 

## 2014-11-21 DIAGNOSIS — R310 Gross hematuria: Secondary | ICD-10-CM | POA: Insufficient documentation

## 2014-11-21 LAB — URINE CULTURE
COLONY COUNT: NO GROWTH
ORGANISM ID, BACTERIA: NO GROWTH

## 2014-11-21 LAB — BASIC METABOLIC PANEL
BUN: 22 mg/dL (ref 6–23)
CHLORIDE: 102 meq/L (ref 96–112)
CO2: 33 mEq/L — ABNORMAL HIGH (ref 19–32)
CREATININE: 1.01 mg/dL (ref 0.40–1.50)
Calcium: 9.6 mg/dL (ref 8.4–10.5)
GFR: 80.96 mL/min (ref >60.00)
Glucose, Bld: 117 mg/dL — ABNORMAL HIGH (ref 70–99)
Potassium: 5 mEq/L (ref 3.5–5.1)
Sodium: 139 mEq/L (ref 135–145)

## 2014-11-21 LAB — URINALYSIS, ROUTINE W REFLEX MICROSCOPIC
Bilirubin Urine: NEGATIVE
Hgb urine dipstick: NEGATIVE
Ketones, ur: NEGATIVE
Leukocytes, UA: NEGATIVE
Nitrite: NEGATIVE
PH: 5.5 (ref 5.0–8.0)
RBC / HPF: NONE SEEN (ref 0–?)
Specific Gravity, Urine: >=1.03 — AB (ref 1.000–1.030)
Total Protein, Urine: NEGATIVE
UROBILINOGEN UA: 0.2 (ref 0.0–1.0)
Urine Glucose: NEGATIVE
WBC, UA: NONE SEEN (ref 0–?)

## 2014-11-21 LAB — PSA: PSA: 0.94 ng/mL (ref 0.10–4.00)

## 2014-11-21 NOTE — Assessment & Plan Note (Signed)
  Brian Gibson is a 59 year old man presenting with a three day period of gross hematuria and feelings of urinary urgency during strenuous exercise. He has had resolution of symptoms upon stopping the strenuous exercise. Patient denied dysuria, fever, chills, and flank pain. Exercise-induced hematuria is the most probable diagnosis.   Other considerations include prostatitis, a infection or stones but that is less likely. A malignancy is also in the differential with the patient being   59 years old and a smoker Plan: Will check PSA (? Prostatitis), BMP ,a urine culture for UTI, and will refer patient to urology to rule out other causes of gross hematuria.  Addendum, the patient is quite hesitant to proceed with a urology eval mostly because his high deductible insurance. I explained him frankly that the standard of care under the circumstances is further evaluation by urology given that cancer is in the differential. He seems to understand, he continued to be reluctant; I eventually encouraged him to please go to with a urologist and discuss the situation with him.

## 2014-11-28 ENCOUNTER — Encounter: Payer: Self-pay | Admitting: Internal Medicine

## 2014-12-11 ENCOUNTER — Encounter: Payer: Self-pay | Admitting: Internal Medicine

## 2014-12-12 ENCOUNTER — Telehealth: Payer: Self-pay

## 2014-12-12 MED ORDER — ALPRAZOLAM 0.5 MG PO TABS: 0.2500 mg | ORAL_TABLET | Freq: Two times a day (BID) | ORAL | Status: DC | PRN

## 2014-12-12 NOTE — Telephone Encounter (Signed)
Rx faxed to [facility] pharmacy.  

## 2014-12-12 NOTE — Telephone Encounter (Signed)
Rx printed, awaiting MD signature.  

## 2014-12-12 NOTE — Telephone Encounter (Signed)
-----   Message from Colon Branch, MD sent at 12/11/2014  6:55 PM EDT ----- Regarding: print xanax, let pt know is ready (see messages) Print a printed  prescription for alprazolam 0.5 mg Half or one tablet twice a day as needed for anxiety, #30, no refills

## 2015-02-14 ENCOUNTER — Telehealth: Payer: Self-pay | Admitting: Internal Medicine

## 2015-02-14 ENCOUNTER — Encounter: Payer: Self-pay | Admitting: Internal Medicine

## 2015-02-14 ENCOUNTER — Other Ambulatory Visit: Payer: Self-pay | Admitting: Internal Medicine

## 2015-02-14 MED ORDER — ALPRAZOLAM 0.5 MG PO TABS: 0.2500 mg | ORAL_TABLET | Freq: Two times a day (BID) | ORAL | Status: DC | PRN

## 2015-02-14 NOTE — Telephone Encounter (Signed)
Pt is requesting refill on Alprazolam.  Last OV: 11/20/2014  Last Fill: 12/12/2014 #30 0RF UDS: None  Please advise.

## 2015-02-14 NOTE — Telephone Encounter (Signed)
Labs already ordered on November 06, 2014 as future orders. Pt needs FLP, AST, ALT, FASTING. Thank you.

## 2015-02-14 NOTE — Telephone Encounter (Signed)
Relation to pt: self Call back number: [phone]  Reason for call:  Pt states as per MyChart Email MD advised pt in March to follow up for labs only in 3 month. Pt scheduled lab appointment for 02/18/15. Requesting orders

## 2015-02-14 NOTE — Telephone Encounter (Signed)
Rx faxed to [facility].

## 2015-02-14 NOTE — Telephone Encounter (Signed)
Okay #30 and one refill 

## 2015-02-18 ENCOUNTER — Other Ambulatory Visit (INDEPENDENT_AMBULATORY_CARE_PROVIDER_SITE_OTHER): Payer: BLUE CROSS/BLUE SHIELD

## 2015-02-18 DIAGNOSIS — E785 Hyperlipidemia, unspecified: Secondary | ICD-10-CM | POA: Diagnosis not present

## 2015-02-18 LAB — LIPID PANEL
Cholesterol: 166 mg/dL (ref 0–200)
HDL: 61.6 mg/dL (ref >39.00)
LDL CALC: 92 mg/dL (ref 0–99)
NonHDL: 104.4
TRIGLYCERIDES: 60 mg/dL (ref 0.0–149.0)
Total CHOL/HDL Ratio: 3
VLDL: 12 mg/dL (ref 0.0–40.0)

## 2015-02-18 LAB — AST: AST: 30 U/L (ref 0–37)

## 2015-02-18 LAB — ALT: ALT: 36 U/L (ref 0–53)

## 2015-02-21 ENCOUNTER — Encounter: Payer: Self-pay | Admitting: Internal Medicine

## 2015-05-22 ENCOUNTER — Other Ambulatory Visit: Payer: Self-pay | Admitting: Internal Medicine

## 2015-06-04 ENCOUNTER — Encounter: Payer: Self-pay | Admitting: Internal Medicine

## 2015-06-25 DIAGNOSIS — S83519A Sprain of anterior cruciate ligament of unspecified knee, initial encounter: Secondary | ICD-10-CM

## 2015-06-25 HISTORY — DX: Sprain of anterior cruciate ligament of unspecified knee, initial encounter: S83.519A

## 2015-07-01 ENCOUNTER — Encounter: Payer: Self-pay | Admitting: Internal Medicine

## 2015-07-31 ENCOUNTER — Encounter: Payer: Self-pay | Admitting: Internal Medicine

## 2015-08-08 ENCOUNTER — Telehealth: Payer: Self-pay | Admitting: Internal Medicine

## 2015-08-16 NOTE — Telephone Encounter (Signed)
error 

## 2015-09-02 ENCOUNTER — Encounter: Payer: BLUE CROSS/BLUE SHIELD | Admitting: Physician Assistant

## 2015-09-03 ENCOUNTER — Encounter: Payer: Self-pay | Admitting: Internal Medicine

## 2015-09-10 ENCOUNTER — Encounter: Payer: Self-pay | Admitting: Internal Medicine

## 2015-09-16 ENCOUNTER — Encounter: Payer: Self-pay | Admitting: Physician Assistant

## 2015-09-16 ENCOUNTER — Ambulatory Visit (INDEPENDENT_AMBULATORY_CARE_PROVIDER_SITE_OTHER): Payer: BLUE CROSS/BLUE SHIELD | Admitting: Physician Assistant

## 2015-09-16 VITALS — BP 138/92 | HR 67 | Temp 98.6°F | Resp 16 | Ht 68.0 in | Wt 211.8 lb

## 2015-09-16 DIAGNOSIS — Z125 Encounter for screening for malignant neoplasm of prostate: Secondary | ICD-10-CM | POA: Diagnosis not present

## 2015-09-16 DIAGNOSIS — E785 Hyperlipidemia, unspecified: Secondary | ICD-10-CM

## 2015-09-16 DIAGNOSIS — Z0000 Encounter for general adult medical examination without abnormal findings: Secondary | ICD-10-CM

## 2015-09-16 LAB — URINALYSIS, ROUTINE W REFLEX MICROSCOPIC
Bilirubin Urine: NEGATIVE
Hgb urine dipstick: NEGATIVE
Ketones, ur: NEGATIVE
LEUKOCYTES UA: NEGATIVE
NITRITE: NEGATIVE
PH: 6 (ref 5.0–8.0)
Total Protein, Urine: NEGATIVE
Urine Glucose: NEGATIVE
Urobilinogen, UA: 0.2 (ref 0.0–1.0)

## 2015-09-16 LAB — TSH: TSH: 1.42 u[IU]/mL (ref 0.35–4.50)

## 2015-09-16 LAB — COMPREHENSIVE METABOLIC PANEL
ALT: 50 U/L (ref 0–53)
AST: 25 U/L (ref 0–37)
Albumin: 4.7 g/dL (ref 3.5–5.2)
Alkaline Phosphatase: 66 U/L (ref 39–117)
BUN: 17 mg/dL (ref 6–23)
CO2: 29 meq/L (ref 19–32)
CREATININE: 0.99 mg/dL (ref 0.40–1.50)
Calcium: 9.5 mg/dL (ref 8.4–10.5)
Chloride: 102 mEq/L (ref 96–112)
GFR: 82.61 mL/min (ref >60.00)
Glucose, Bld: 122 mg/dL — ABNORMAL HIGH (ref 70–99)
Potassium: 4.3 mEq/L (ref 3.5–5.1)
SODIUM: 141 meq/L (ref 135–145)
Total Bilirubin: 0.7 mg/dL (ref 0.2–1.2)
Total Protein: 7 g/dL (ref 6.0–8.3)

## 2015-09-16 LAB — CBC
HCT: 46.6 % (ref 39.0–52.0)
Hemoglobin: 15.9 g/dL (ref 13.0–17.0)
MCHC: 34 g/dL (ref 30.0–36.0)
MCV: 88.2 fl (ref 78.0–100.0)
Platelets: 255 10*3/uL (ref 150.0–400.0)
RBC: 5.28 Mil/uL (ref 4.22–5.81)
RDW: 12.6 % (ref 11.5–15.5)
WBC: 7 10*3/uL (ref 4.0–10.5)

## 2015-09-16 LAB — PSA: PSA: 1.32 ng/mL (ref 0.10–4.00)

## 2015-09-16 LAB — HEMOGLOBIN A1C: HEMOGLOBIN A1C: 5.9 % (ref 4.6–6.5)

## 2015-09-16 NOTE — Progress Notes (Signed)
Pre visit review using our clinic review tool, if applicable. No additional management support is needed unless otherwise documented below in the visit note. 

## 2015-09-16 NOTE — Progress Notes (Signed)
Patient presents to clinic today for annual exam.  Patient is fasting for labs.  Chronic Issues: Hyperlipidemia -- Currently on Lipitor 40 mg daily. Endorses taking as directed. Notes some increased joint aches after taking medication.  Health Maintenance: Immunizations -- declines flu shot. Colonoscopy -- up-to-date   Past Medical History  Diagnosis Date  . Congenital deafness   . Hyperlipidemia   . Allergy     RHINITIS  . Atypical nevus 2007    dr Gretta Cool, saw him ~ 04-2013  . Ganglion   . ACL sprain 06/2015    Past Surgical History  Procedure Laterality Date  . Appendectomy    . Vasectomy    . Rotator cuff repair Bilateral 2009  . Tonsillectomy  1961  . Ganglion cyst excision Right     2 times    Current Outpatient Prescriptions on File Prior to Visit  Medication Sig Dispense Refill  . ALPRAZolam (XANAX) 0.5 MG tablet Take 0.5-1 tablets (0.25-0.5 mg total) by mouth 2 (two) times daily as needed for anxiety. 30 tablet 1  . atorvastatin (LIPITOR) 40 MG tablet Take 1 tablet (40 mg total) by mouth daily. 90 tablet 0  . fluticasone (FLONASE) 50 MCG/ACT nasal spray Place 2 sprays into both nostrils daily. 16 g 11  . Omega-3 Fatty Acids (FISH OIL TRIPLE STRENGTH) 1400 MG CAPS Take by mouth.      . tadalafil (CIALIS) 20 MG tablet Take 1/2 to 1 tablet by mouth as needed. 10 tablet 1   No current facility-administered medications on file prior to visit.    Allergies  Allergen Reactions  . Erythromycin Nausea And Vomiting    Family History  Problem Relation Age of Onset  . Hypertension Mother   . Hypertension Father   . Transient ischemic attack Father   . Cancer Neg Hx   . Diabetes Neg Hx   . Stroke Neg Hx   . CAD Neg Hx   . Colon cancer Neg Hx   . Cardiomyopathy Sister     hyperthrophic     Social History   Social History  . Marital Status: Married    Spouse Name: N/A  . Number of Children: 2  . Years of Education: N/A   Occupational History  .  COMPUTER PROGRAMMER    Social History Main Topics  . Smoking status: Current Some Day Smoker    Types: Cigarettes, Cigars  . Smokeless tobacco: Never Used     Comment: occasional cigar  . Alcohol Use: 8.4 oz/week    14 Glasses of wine per week     Comment: socially   . Drug Use: No  . Sexual Activity: Not on file   Other Topics Concern  . Not on file   Social History Narrative    Communicate w/ pt via mychart or  #  [phone] (RELAY PHONE)   Review of Systems  Constitutional: Negative for fever and weight loss.  HENT: Negative for ear discharge, ear pain, hearing loss and tinnitus.   Eyes: Negative for blurred vision, double vision, photophobia and pain.  Respiratory: Negative for cough and shortness of breath.   Cardiovascular: Negative for chest pain and palpitations.  Gastrointestinal: Negative for heartburn, nausea, vomiting, abdominal pain, diarrhea, constipation, blood in stool and melena.  Genitourinary: Negative for dysuria, urgency, frequency, hematuria and flank pain.  Musculoskeletal: Positive for joint pain. Negative for falls.  Neurological: Negative for dizziness, loss of consciousness and headaches.  Endo/Heme/Allergies: Negative for environmental allergies.  Psychiatric/Behavioral: Negative for depression, suicidal ideas, hallucinations and substance abuse. The patient is not nervous/anxious and does not have insomnia.     BP 138/92 mmHg  Pulse 67  Temp(Src) 98.6 F (37 C) (Oral)  Resp 16  Ht 5' 8"  (1.727 m)  Wt 211 lb 12.8 oz (96.072 kg)  BMI 32.21 kg/m2  SpO2 98%  Physical Exam  Constitutional: He is oriented to person, place, and time and well-developed, well-nourished, and in no distress.  HENT:  Head: Normocephalic and atraumatic.  Right Ear: External ear normal.  Left Ear: External ear normal.  Nose: Nose normal.  Mouth/Throat: Oropharynx is clear and moist. No oropharyngeal exudate.  Eyes: Conjunctivae and EOM are normal. Pupils are equal,  round, and reactive to light.  Neck: Neck supple. No thyromegaly present.  Cardiovascular: Normal rate, regular rhythm, normal heart sounds and intact distal pulses.   Pulmonary/Chest: Effort normal and breath sounds normal. No respiratory distress. He has no wheezes. He has no rales. He exhibits no tenderness.  Abdominal: Soft. Bowel sounds are normal. He exhibits no distension and no mass. There is no tenderness. There is no rebound and no guarding.  Genitourinary: Testes/scrotum normal.  Defers GU exam and DRE.  Lymphadenopathy:    He has no cervical adenopathy.  Neurological: He is alert and oriented to person, place, and time.  Skin: Skin is warm and dry. No rash noted.  Psychiatric: Affect normal.  Vitals reviewed.   Recent Results (from the past 2160 hour(s))  CBC     Status: None   Collection Time: 09/16/15  8:45 AM  Result Value Ref Range   WBC 7.0 4.0 - 10.5 K/uL   RBC 5.28 4.22 - 5.81 Mil/uL   Platelets 255.0 150.0 - 400.0 K/uL   Hemoglobin 15.9 13.0 - 17.0 g/dL   HCT 46.6 39.0 - 52.0 %   MCV 88.2 78.0 - 100.0 fl   MCHC 34.0 30.0 - 36.0 g/dL   RDW 12.6 11.5 - 15.5 %  Comp Met (CMET)     Status: Abnormal   Collection Time: 09/16/15  8:45 AM  Result Value Ref Range   Sodium 141 135 - 145 mEq/L   Potassium 4.3 3.5 - 5.1 mEq/L   Chloride 102 96 - 112 mEq/L   CO2 29 19 - 32 mEq/L   Glucose, Bld 122 (H) 70 - 99 mg/dL   BUN 17 6 - 23 mg/dL   Creatinine, Ser 0.99 0.40 - 1.50 mg/dL   Total Bilirubin 0.7 0.2 - 1.2 mg/dL   Alkaline Phosphatase 66 39 - 117 U/L   AST 25 0 - 37 U/L   ALT 50 0 - 53 U/L   Total Protein 7.0 6.0 - 8.3 g/dL   Albumin 4.7 3.5 - 5.2 g/dL   Calcium 9.5 8.4 - 10.5 mg/dL   GFR 82.61 >60.00 mL/min  TSH     Status: None   Collection Time: 09/16/15  8:45 AM  Result Value Ref Range   TSH 1.42 0.35 - 4.50 uIU/mL  Hemoglobin A1c     Status: None   Collection Time: 09/16/15  8:45 AM  Result Value Ref Range   Hgb A1c MFr Bld 5.9 4.6 - 6.5 %     Comment: Glycemic Control Guidelines for People with Diabetes:Non Diabetic:  <6%Goal of Therapy: <7%Additional Action Suggested:  >8%   Urinalysis, Routine w reflex microscopic     Status: Abnormal   Collection Time: 09/16/15  8:45 AM  Result Value Ref Range  Color, Urine YELLOW Yellow;Lt. Yellow   APPearance CLEAR Clear   Specific Gravity, Urine <=1.005 (A) 1.000 - 1.030   pH 6.0 5.0 - 8.0   Total Protein, Urine NEGATIVE Negative   Urine Glucose NEGATIVE Negative   Ketones, ur NEGATIVE Negative   Bilirubin Urine NEGATIVE Negative   Hgb urine dipstick NEGATIVE Negative   Urobilinogen, UA 0.2 0.0 - 1.0   Leukocytes, UA NEGATIVE Negative   Nitrite NEGATIVE Negative  PSA     Status: None   Collection Time: 09/16/15  8:45 AM  Result Value Ref Range   PSA 1.32 0.10 - 4.00 ng/mL    Assessment/Plan: Visit for preventive health examination Depression screen negative. Health Maintenance reviewed -- Declines flu shot. Tetanus up-to-date. Colonoscopy up-to-date. Preventive schedule discussed and handout given in AVS. Will obtain fasting labs today.   Prostate cancer screening Declines DRE. Will proceed with PSA today.  Hyperlipidemia Some arthralgias. Will check lipid panel today. If well-controlled, may consider changing stating for these aches.

## 2015-09-16 NOTE — Patient Instructions (Signed)
Please go to the lab for blood work.  I will call you with your results. If your blood work is normal we will follow-up yearly for physicals.  If anything is abnormal we will treat accordingly.  If cholesterol is stable we may be able to consider a change in medication to help with joint aches.  Please take an Aleve daily when needed. For the arm, rest it and apply ice and topical Aspercreme to the area. Follow-up if symptoms are not improving.Marland Kitchen

## 2015-09-16 NOTE — Assessment & Plan Note (Signed)
Declines DRE. Will proceed with PSA today.

## 2015-09-16 NOTE — Assessment & Plan Note (Signed)
Some arthralgias. Will check lipid panel today. If well-controlled, may consider changing stating for these aches.

## 2015-09-16 NOTE — Assessment & Plan Note (Signed)
Depression screen negative. Health Maintenance reviewed -- Declines flu shot. Tetanus up-to-date. Colonoscopy up-to-date. Preventive schedule discussed and handout given in AVS. Will obtain fasting labs today.

## 2015-09-17 ENCOUNTER — Other Ambulatory Visit (INDEPENDENT_AMBULATORY_CARE_PROVIDER_SITE_OTHER): Payer: BLUE CROSS/BLUE SHIELD

## 2015-09-17 DIAGNOSIS — E785 Hyperlipidemia, unspecified: Secondary | ICD-10-CM

## 2015-09-17 LAB — LIPID PANEL
CHOLESTEROL: 173 mg/dL (ref 0–200)
HDL: 59.7 mg/dL (ref >39.00)
LDL CALC: 92 mg/dL (ref 0–99)
NonHDL: 113.26
TRIGLYCERIDES: 108 mg/dL (ref 0.0–149.0)
Total CHOL/HDL Ratio: 3
VLDL: 21.6 mg/dL (ref 0.0–40.0)

## 2015-09-18 ENCOUNTER — Encounter: Payer: Self-pay | Admitting: Physician Assistant

## 2015-09-25 ENCOUNTER — Other Ambulatory Visit: Payer: Self-pay | Admitting: Internal Medicine

## 2015-10-17 ENCOUNTER — Encounter: Payer: Self-pay | Admitting: Internal Medicine

## 2015-10-17 MED ORDER — FLUTICASONE PROPIONATE 50 MCG/ACT NA SUSP: 2.0000 | Freq: Every day | NASAL | Status: DC

## 2015-10-17 NOTE — Telephone Encounter (Signed)
Okay Xanax No. 30 and one refill Okay to send a prescription for Flonase x 1 year

## 2015-10-17 NOTE — Telephone Encounter (Signed)
Pt is requesting refill on Alprazolam. Requesting 90 day supply for mail order.  Last OV: 11/20/2014, saw [facility] 09/16/2015 for CPE Last Fill: 02/14/2015 #30 and 1RF Pt sig: 1/2-1 tablet bid PRN UDS: None  Please advise.

## 2015-10-18 MED ORDER — ALPRAZOLAM 0.5 MG PO TABS: 0.2500 mg | ORAL_TABLET | Freq: Two times a day (BID) | ORAL | Status: DC | PRN

## 2015-10-18 NOTE — Telephone Encounter (Signed)
Ok xanax 90 days

## 2015-10-18 NOTE — Telephone Encounter (Signed)
Pt is requesting 90 day supply since the prescription goes to mail order. Please advise.

## 2015-10-18 NOTE — Telephone Encounter (Signed)
Rx faxed to [place]. Flonase sent.

## 2015-10-18 NOTE — Telephone Encounter (Signed)
Rx printed, awaiting MD signature.  

## 2016-03-19 IMAGING — CR DG CHEST 2V
2 series · 2 of 2 positions shown · non-contrast
Comparison: 12/13/2012.

CLINICAL DATA: Cough for 1 month.

EXAM:
CHEST  2 VIEW

[w chest pa]
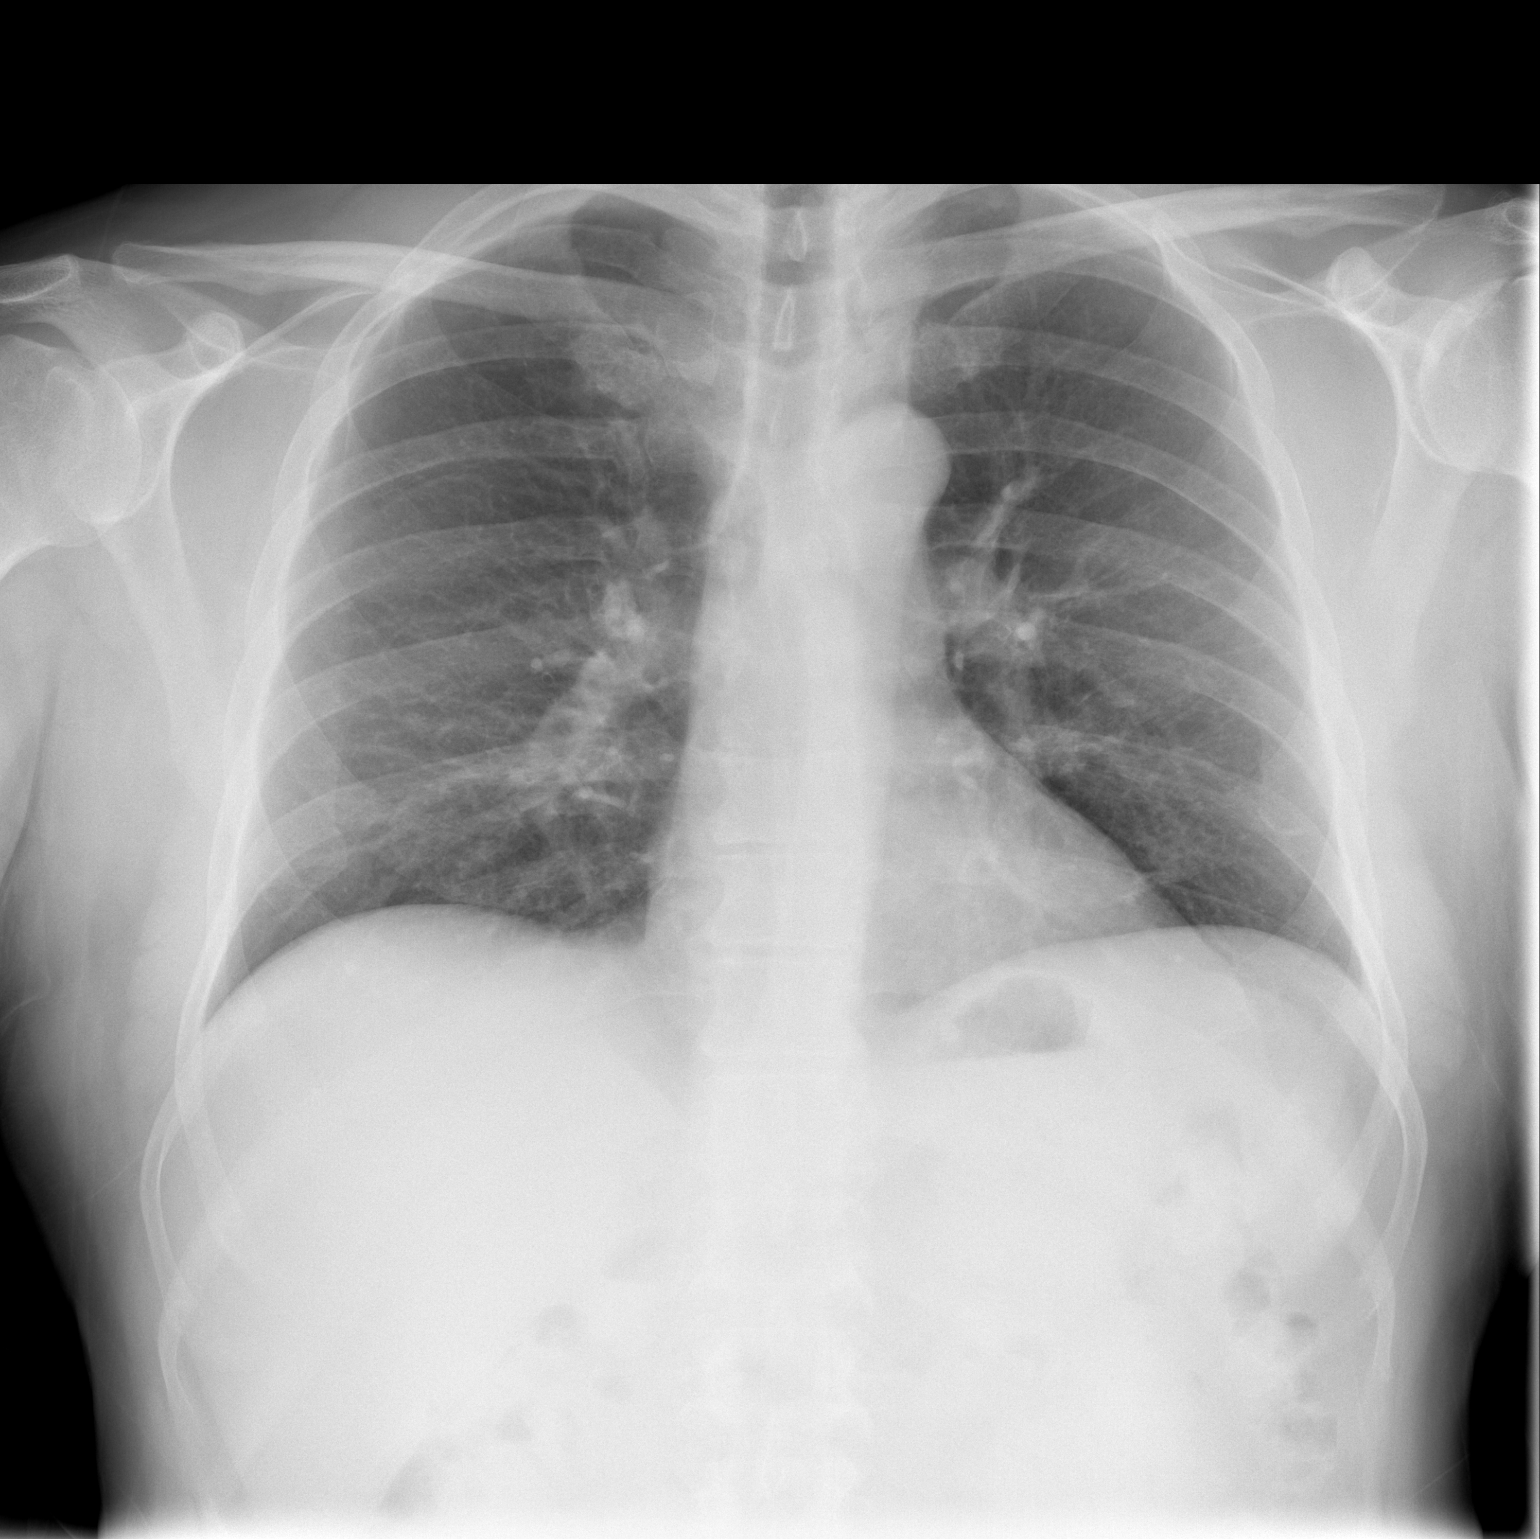

[w chest lat]
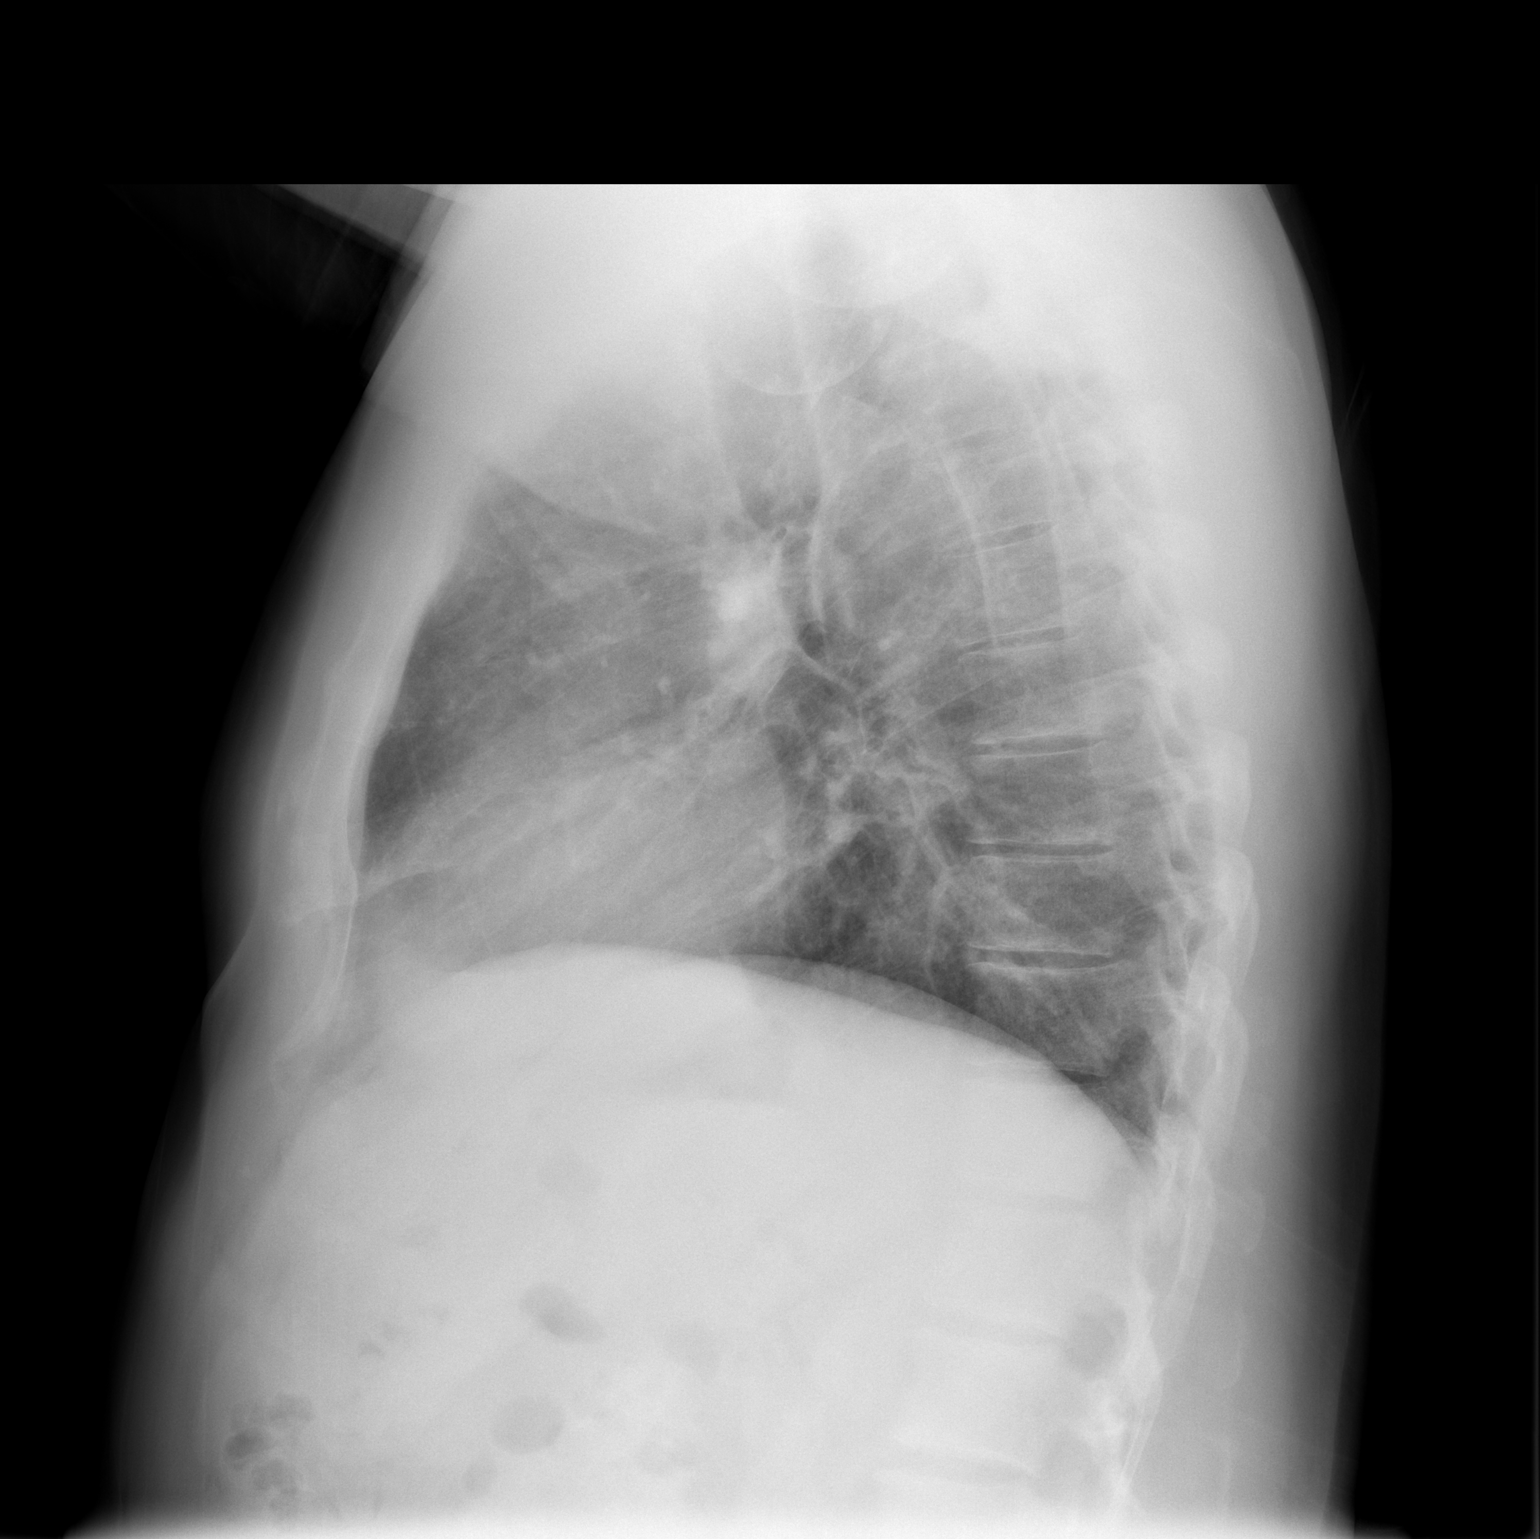

[2 of 2 positions shown; findings below may reference images not displayed]

FINDINGS: Mediastinum and hilar structures normal. The lungs are clear. Mild
left base pleural parenchymal thickening consistent with scarring .
Heart size normal. No pleural effusion or pneumothorax. No acute
bony abnormality .
IMPRESSION: Mild left base pleural parenchymal thickening again noted consistent
with scarring. No acute cardiopulmonary disease.

## 2016-03-23 ENCOUNTER — Other Ambulatory Visit: Payer: Self-pay | Admitting: Internal Medicine

## 2016-03-23 ENCOUNTER — Encounter: Payer: Self-pay | Admitting: Internal Medicine

## 2016-03-23 MED ORDER — SILDENAFIL CITRATE 100 MG PO TABS: 50.0000 mg | ORAL_TABLET | Freq: Every day | ORAL | 0 refills | Status: DC | PRN

## 2016-04-30 ENCOUNTER — Encounter: Payer: Self-pay | Admitting: Internal Medicine

## 2016-06-04 ENCOUNTER — Encounter: Payer: Self-pay | Admitting: Internal Medicine

## 2016-06-04 DIAGNOSIS — D229 Melanocytic nevi, unspecified: Secondary | ICD-10-CM

## 2016-08-07 ENCOUNTER — Encounter: Payer: Self-pay | Admitting: Internal Medicine

## 2016-08-07 MED ORDER — ATORVASTATIN CALCIUM 40 MG PO TABS: 40.0000 mg | ORAL_TABLET | Freq: Every day | ORAL | 0 refills | Status: DC

## 2016-09-16 ENCOUNTER — Ambulatory Visit (INDEPENDENT_AMBULATORY_CARE_PROVIDER_SITE_OTHER): Payer: BLUE CROSS/BLUE SHIELD | Admitting: Internal Medicine

## 2016-09-16 ENCOUNTER — Encounter: Payer: Self-pay | Admitting: Internal Medicine

## 2016-09-16 ENCOUNTER — Encounter: Payer: BLUE CROSS/BLUE SHIELD | Admitting: Internal Medicine

## 2016-09-16 VITALS — BP 116/78 | HR 61 | Temp 97.9°F | Resp 14 | Ht 68.0 in | Wt 189.0 lb

## 2016-09-16 DIAGNOSIS — E785 Hyperlipidemia, unspecified: Secondary | ICD-10-CM

## 2016-09-16 DIAGNOSIS — Z1159 Encounter for screening for other viral diseases: Secondary | ICD-10-CM

## 2016-09-16 DIAGNOSIS — Z0000 Encounter for general adult medical examination without abnormal findings: Secondary | ICD-10-CM

## 2016-09-16 DIAGNOSIS — Z114 Encounter for screening for human immunodeficiency virus [HIV]: Secondary | ICD-10-CM

## 2016-09-16 DIAGNOSIS — R739 Hyperglycemia, unspecified: Secondary | ICD-10-CM

## 2016-09-16 DIAGNOSIS — R310 Gross hematuria: Secondary | ICD-10-CM

## 2016-09-16 MED ORDER — ALPRAZOLAM 0.5 MG PO TABS: 0.2500 mg | ORAL_TABLET | Freq: Two times a day (BID) | ORAL | 0 refills | Status: DC | PRN

## 2016-09-16 MED ORDER — ATORVASTATIN CALCIUM 40 MG PO TABS: 40.0000 mg | ORAL_TABLET | Freq: Every day | ORAL | 2 refills | Status: DC

## 2016-09-16 NOTE — Patient Instructions (Addendum)
    GO TO THE FRONT DESK Schedule your next appointment for a  Physical in 1 year  Schedule labs to be done  tomorrow or the next day, fasting (blood work, urine tests, UDS)  We are refilling your Xanax and Lipitor

## 2016-09-16 NOTE — Progress Notes (Signed)
Subjective:    Patient ID: Brian Gibson, male    DOB: [birth year], 61 y.o.   MRN: [id number]  DOS:  09/16/2016 Type of visit - description : CPX Interval history: No major concerns, we did talk about some other issues, see below   Review of Systems  Constitutional: No fever. No chills. No unexplained wt changes. No unusual sweats  HEENT: No dental problems, no ear discharge, no facial swelling, no voice changes. No eye discharge, no eye  redness , no  intolerance to light   Respiratory: No wheezing , no  difficulty breathing. No cough , no mucus production  Cardiovascular: No CP, no leg swelling , no  Palpitations  GI: no nausea, no vomiting, no diarrhea , no  abdominal pain.  No blood in the stools. No dysphagia, no odynophagia    Endocrine: No polyphagia, no polyuria , no polydipsia  GU: No dysuria, gross hematuria, difficulty urinating. No urinary urgency, no frequency.  Musculoskeletal: No joint swellings or unusual aches or pains  Skin: No change in the color of the skin, palor , no  Rash  Allergic, immunologic: No environmental allergies , no  food allergies  Neurological: No dizziness no  syncope. No headaches. No diplopia, no slurred, no slurred speech, no motor deficits, no facial  Numbness  Hematological: No enlarged lymph nodes, no easy bruising , no unusual bleedings  Psychiatry: No suicidal ideas, no hallucinations, no beavior problems, no confusion.  + Stress, work related, takes Xanax as needed.  Past Medical History:  Diagnosis Date  . ACL sprain 06/2015  . Allergy    RHINITIS  . Atypical nevus 2007   dr Gretta Cool, saw him ~ 04-2013  . Congenital deafness   . Ganglion   . Hyperlipidemia     Past Surgical History:  Procedure Laterality Date  . APPENDECTOMY    . GANGLION CYST EXCISION Right    2 times  . ROTATOR CUFF REPAIR Bilateral 2009  . TONSILLECTOMY  1961  . VASECTOMY      Social History   Social History  . Marital status: Married    Spouse name: N/A  . Number of children: 2  . Years of education: N/A   Occupational History  . COMPUTER PROGRAMMER    Social History Main Topics  . Smoking status: Current Some Day Smoker    Types: Cigarettes, Cigars  . Smokeless tobacco: Never Used     Comment: occasional cigar  . Alcohol use 0.6 oz/week    1 Glasses of wine per week     Comment: socially   . Drug use: No  . Sexual activity: Not on file   Other Topics Concern  . Not on file   Social History Narrative    Communicate w/ pt via mychart or  #  [phone] (RELAY PHONE)     Family History  Problem Relation Age of Onset  . Hypertension Mother   . Hypertension Father   . Transient ischemic attack Father   . Prostate cancer Father 71  . Cardiomyopathy Sister     hyperthrophic   . Cancer Neg Hx   . Diabetes Neg Hx   . Stroke Neg Hx   . CAD Neg Hx   . Colon cancer Neg Hx      Allergies as of 09/16/2016      Reactions   Erythromycin Nausea And Vomiting      Medication List       Accurate as of 09/16/16 11:59  PM. Always use your most recent med list.          ALPRAZolam 0.5 MG tablet Commonly known as:  XANAX Take 0.5-1 tablets (0.25-0.5 mg total) by mouth 2 (two) times daily as needed for anxiety.   atorvastatin 40 MG tablet Commonly known as:  LIPITOR Take 1 tablet (40 mg total) by mouth daily.   diphenhydrAMINE 50 MG tablet Commonly known as:  BENADRYL Take 50 mg by mouth at bedtime as needed for sleep.   FISH OIL TRIPLE STRENGTH 1400 MG Caps Take by mouth.   fluticasone 50 MCG/ACT nasal spray Commonly known as:  FLONASE Place 2 sprays into both nostrils daily.   MULTIVITAMIN ADULT PO Take 1 tablet by mouth daily.   ranitidine 150 MG tablet Commonly known as:  ZANTAC Take 150 mg by mouth at bedtime.   sildenafil 100 MG tablet Commonly known as:  VIAGRA Take 0.5-1 tablets (50-100 mg total) by mouth daily as needed for erectile dysfunction.   vitamin C 500 MG tablet Commonly  known as:  ASCORBIC ACID Take 500 mg by mouth daily.          Objective:   Physical Exam BP 116/78 (BP Location: Left Arm, Patient Position: Sitting, Cuff Size: Normal)   Pulse 61   Temp 97.9 F (36.6 C) (Oral)   Resp 14   Ht 5\' 8"  (1.727 m)   Wt 189 lb (85.7 kg)   SpO2 96%   BMI 28.74 kg/m   General:   Well developed, well nourished . NAD.  Neck: No  thyromegaly  HEENT:  Normocephalic . Face symmetric, atraumatic Lungs:  CTA B Normal respiratory effort, no intercostal retractions, no accessory muscle use. Heart: RRR,  no murmur.  No pretibial edema bilaterally  Abdomen:  Not distended, soft, non-tender. No rebound or rigidity.   Skin: Exposed areas without rash. Not pale. Not jaundice Rectal:  External abnormalities: none. Normal sphincter tone. No rectal masses or tenderness.  Stools not  found Prostate: Prostate gland firm and smooth, no enlargement, nodularity, tenderness, mass, asymmetry or induration.  Neurologic:  alert & oriented X3.  Speech normal, gait appropriate for age and unassisted Strength symmetric and appropriate for age.  Psych: Cognition and judgment appear intact.  Cooperative with normal attention span and concentration.  Behavior appropriate. No anxious or depressed appearing.    Assessment & Plan:   Assessment Prediabetes: A1c 6.3 and this is 2015) Hyperlipidemia Congenital deafness GERD Atypical nevus, Dr. Gretta Cool 2014 H/o gross hematuria 2016 + FH hypertrophic cardiomyopathy, echo (-) 07-2014  Plan:  Prediabetes: Doing a "ketone diet" rich in proteins, like his ketones checked. Will do. Hyperlipidemia: Ran out of Lipitor, 4 weeks ago. RF lipitor.  Checking labs (realizing he was out of lipitor x 4 weeks) Gross hematuria 2016: Was referred to urology, patient decided not to pursue, no further episodes. He belives was simply dehydrated. Advise patient that pain is hematuria at his age needs further workup even if he had a single  episode to rule out malignancy. Offered to  reschedule  Referral , declined , verbalized understanding. Anxiety: Work related, takes xanax, RF,  UDS and contract today RTC 1 year

## 2016-09-16 NOTE — Progress Notes (Signed)
Pre visit review using our clinic review tool, if applicable. No additional management support is needed unless otherwise documented below in the visit note. 

## 2016-09-16 NOTE — Assessment & Plan Note (Addendum)
Td  01-2013; zostavax discussed , will wait till 61 y/o ; Offered a flu shot--declined  Cscope 11-09---> 4 mm polyp, not recovered- Cscope again 2-15, tics, no polyps, 10 years  Prostate cancer screening: DRE normal today, check a PSA. Labs BMP, AST, ALT, FLP, CBC, A1c, PSA, UA, urine culture, urine ketones. Will also check HIV and hep C. diet and physically active-- counseled

## 2016-09-16 NOTE — Progress Notes (Signed)
Alprazolam Rx faxed to [place].

## 2016-09-17 ENCOUNTER — Encounter: Payer: Self-pay | Admitting: Internal Medicine

## 2016-09-17 ENCOUNTER — Telehealth: Payer: Self-pay | Admitting: Internal Medicine

## 2016-09-17 ENCOUNTER — Other Ambulatory Visit (INDEPENDENT_AMBULATORY_CARE_PROVIDER_SITE_OTHER): Payer: BLUE CROSS/BLUE SHIELD

## 2016-09-17 DIAGNOSIS — Z09 Encounter for follow-up examination after completed treatment for conditions other than malignant neoplasm: Secondary | ICD-10-CM | POA: Insufficient documentation

## 2016-09-17 DIAGNOSIS — R310 Gross hematuria: Secondary | ICD-10-CM

## 2016-09-17 DIAGNOSIS — Z0000 Encounter for general adult medical examination without abnormal findings: Secondary | ICD-10-CM | POA: Diagnosis not present

## 2016-09-17 DIAGNOSIS — Z114 Encounter for screening for human immunodeficiency virus [HIV]: Secondary | ICD-10-CM

## 2016-09-17 DIAGNOSIS — R739 Hyperglycemia, unspecified: Secondary | ICD-10-CM

## 2016-09-17 DIAGNOSIS — E785 Hyperlipidemia, unspecified: Secondary | ICD-10-CM

## 2016-09-17 DIAGNOSIS — Z1159 Encounter for screening for other viral diseases: Secondary | ICD-10-CM

## 2016-09-17 LAB — AST: AST: 20 U/L (ref 0–37)

## 2016-09-17 LAB — CBC WITH DIFFERENTIAL/PLATELET
BASOS ABS: 0 10*3/uL (ref 0.0–0.1)
Basophils Relative: 0.5 % (ref 0.0–3.0)
EOS ABS: 0.2 10*3/uL (ref 0.0–0.7)
Eosinophils Relative: 3.5 % (ref 0.0–5.0)
HEMATOCRIT: 44.6 % (ref 39.0–52.0)
Hemoglobin: 15.4 g/dL (ref 13.0–17.0)
LYMPHS ABS: 1.4 10*3/uL (ref 0.7–4.0)
LYMPHS PCT: 22.6 % (ref 12.0–46.0)
MCHC: 34.6 g/dL (ref 30.0–36.0)
MCV: 88.5 fl (ref 78.0–100.0)
MONOS PCT: 6.2 % (ref 3.0–12.0)
Monocytes Absolute: 0.4 10*3/uL (ref 0.1–1.0)
NEUTROS PCT: 67.2 % (ref 43.0–77.0)
Neutro Abs: 4.2 10*3/uL (ref 1.4–7.7)
PLATELETS: 247 10*3/uL (ref 150.0–400.0)
RBC: 5.04 Mil/uL (ref 4.22–5.81)
RDW: 13 % (ref 11.5–15.5)
WBC: 6.3 10*3/uL (ref 4.0–10.5)

## 2016-09-17 LAB — URINALYSIS, ROUTINE W REFLEX MICROSCOPIC
Bilirubin Urine: NEGATIVE
HGB URINE DIPSTICK: NEGATIVE
Ketones, ur: NEGATIVE
LEUKOCYTES UA: NEGATIVE
Nitrite: NEGATIVE
PH: 5 (ref 5.0–8.0)
RBC / HPF: NONE SEEN (ref 0–?)
TOTAL PROTEIN, URINE-UPE24: NEGATIVE
URINE GLUCOSE: NEGATIVE
Urobilinogen, UA: 0.2 (ref 0.0–1.0)

## 2016-09-17 LAB — LIPID PANEL
Cholesterol: 270 mg/dL — ABNORMAL HIGH (ref 0–200)
HDL: 59.5 mg/dL
LDL Cholesterol: 194 mg/dL — ABNORMAL HIGH (ref 0–99)
NonHDL: 210.89
Total CHOL/HDL Ratio: 5
Triglycerides: 86 mg/dL (ref 0.0–149.0)
VLDL: 17.2 mg/dL (ref 0.0–40.0)

## 2016-09-17 LAB — HEPATITIS C ANTIBODY: HCV AB: NEGATIVE

## 2016-09-17 LAB — BASIC METABOLIC PANEL
BUN: 21 mg/dL (ref 6–23)
CALCIUM: 9.6 mg/dL (ref 8.4–10.5)
CO2: 28 mEq/L (ref 19–32)
CREATININE: 0.96 mg/dL (ref 0.40–1.50)
Chloride: 102 mEq/L (ref 96–112)
GFR: 85.3 mL/min (ref >60.00)
GLUCOSE: 105 mg/dL — AB (ref 70–99)
Potassium: 3.9 mEq/L (ref 3.5–5.1)
Sodium: 138 mEq/L (ref 135–145)

## 2016-09-17 LAB — ALT: ALT: 26 U/L (ref 0–53)

## 2016-09-17 LAB — HEMOGLOBIN A1C: Hgb A1c MFr Bld: 5.7 % (ref 4.6–6.5)

## 2016-09-17 LAB — PSA: PSA: 1.29 ng/mL (ref 0.10–4.00)

## 2016-09-17 NOTE — Telephone Encounter (Signed)
Form received. Filled in as much as possible.  Forwarded to PCP for review and completion.

## 2016-09-17 NOTE — Assessment & Plan Note (Signed)
  Prediabetes: Doing a "ketone diet" rich in proteins, like his ketones checked. Will do. Hyperlipidemia: Ran out of Lipitor, 4 weeks ago. RF lipitor.  Checking labs (realizing he was out of lipitor x 4 weeks) Gross hematuria 2016: Was referred to urology, patient decided not to pursue, no further episodes. He belives was simply dehydrated. Advise patient that pain is hematuria at his age needs further workup even if he had a single episode to rule out malignancy. Offered to  reschedule  Referral , declined , verbalized understanding. Anxiety: Work related, takes xanax, RF,  UDS and contract today RTC 1 year

## 2016-09-17 NOTE — Telephone Encounter (Signed)
Patient dropped of " Physician Results Form " to office for Dr. Larose Kells to fill out. He needs them faxed to [phone]. Patient has original copy. Placed in basket to go back to the doctors.

## 2016-09-18 LAB — URINE CULTURE: ORGANISM ID, BACTERIA: NO GROWTH

## 2016-09-18 LAB — KETONES, QUALITATIVE

## 2016-09-18 LAB — HIV ANTIBODY (ROUTINE TESTING W REFLEX): HIV 1&2 Ab, 4th Generation: NONREACTIVE

## 2016-09-21 NOTE — Telephone Encounter (Signed)
Form completed, faxed to [facility] at [phone]. Form sent for scanning.

## 2016-09-30 ENCOUNTER — Encounter: Payer: Self-pay | Admitting: Internal Medicine

## 2016-10-12 ENCOUNTER — Encounter: Payer: Self-pay | Admitting: Internal Medicine

## 2016-11-27 ENCOUNTER — Telehealth: Payer: Self-pay

## 2016-11-27 MED ORDER — ALPRAZOLAM 0.5 MG PO TABS: 0.2500 mg | ORAL_TABLET | Freq: Two times a day (BID) | ORAL | 1 refills | Status: DC | PRN

## 2016-11-27 NOTE — Telephone Encounter (Signed)
Okay #180, no refills 

## 2016-11-27 NOTE — Telephone Encounter (Signed)
Pt is requesting refill on Alprazolam.  Last OV: 09/16/2016 Last Fill: 09/16/2016 #180 and 0RF UDS: None  Please advise.

## 2016-11-27 NOTE — Telephone Encounter (Signed)
Rx faxed to [facility].

## 2016-11-27 NOTE — Telephone Encounter (Signed)
Rx printed, awaiting MD signature.  

## 2017-01-12 ENCOUNTER — Encounter: Payer: Self-pay | Admitting: Internal Medicine

## 2017-01-15 ENCOUNTER — Encounter: Payer: Self-pay | Admitting: Internal Medicine

## 2017-01-15 ENCOUNTER — Ambulatory Visit (INDEPENDENT_AMBULATORY_CARE_PROVIDER_SITE_OTHER): Payer: BLUE CROSS/BLUE SHIELD | Admitting: Internal Medicine

## 2017-01-15 VITALS — BP 116/62 | HR 62 | Temp 98.2°F | Resp 14 | Ht 68.0 in | Wt 180.5 lb

## 2017-01-15 DIAGNOSIS — K4090 Unilateral inguinal hernia, without obstruction or gangrene, not specified as recurrent: Secondary | ICD-10-CM | POA: Diagnosis not present

## 2017-01-15 NOTE — Progress Notes (Signed)
Pre visit review using our clinic review tool, if applicable. No additional management support is needed unless otherwise documented below in the visit note. 

## 2017-01-15 NOTE — Patient Instructions (Signed)
   Inguinal Hernia, Adult An inguinal hernia is when fat or the intestines push through the area where the leg meets the lower belly (groin) and make a rounded lump (bulge). This condition happens over time. There are three types of inguinal hernias. These types include:  Hernias that can be pushed back into the belly (are reducible).  Hernias that cannot be pushed back into the belly (are incarcerated).  Hernias that cannot be pushed back into the belly and lose their blood supply (get strangulated). This type needs emergency surgery. Follow these instructions at home: Lifestyle   Drink enough fluid to keep your urine (pee) clear or pale yellow.  Eat plenty of fruits, vegetables, and whole grains. These have a lot of fiber. Talk with your doctor if you have questions.  Avoid lifting heavy objects.  Avoid standing for long periods of time.  Do not use tobacco products. These include cigarettes, chewing tobacco, or e-cigarettes. If you need help quitting, ask your doctor.  Try to stay at a healthy weight. General instructions   Do not try to force the hernia back in.  Watch your hernia for any changes in color or size. Let your doctor know if there are any changes.  Take over-the-counter and prescription medicines only as told by your doctor.  Keep all follow-up visits as told by your doctor. This is important. Contact a doctor if:  You have a fever.  You have new symptoms.  Your symptoms get worse. Get help right away if:  The area where the legs meets the lower belly has:  Pain that gets worse suddenly.  A bulge that gets bigger suddenly and does not go down.  A bulge that turns red or purple.  A bulge that is painful to the touch.  You are a man and your scrotum:  Suddenly feels painful.  Suddenly changes in size.  You feel sick to your stomach (nauseous) and this feeling does not go away.  You throw up (vomit) and this keeps happening.  You feel your  heart beating a lot more quickly than normal.  You cannot poop (have a bowel movement) or pass gas. This information is not intended to replace advice given to you by your health care provider. Make sure you discuss any questions you have with your health care provider. Document Released: 09/10/2006 Document Revised: 01/16/2016 Document Reviewed: 06/20/2014 Elsevier Interactive Patient Education  2017 [employer].

## 2017-01-15 NOTE — Progress Notes (Signed)
Subjective:    Patient ID: Brian Gibson, male    DOB: 1955/09/13, 61 y.o.   MRN: [id number]  DOS:  01/15/2017 Type of visit - description : Acute visit, here with a sign language interpreter Interval history: 2 months ago noted a lump on the right groin when he ride his bike. No real pain; lump changes in size on and off. Concerned about a hernia or a lymph node   Review of Systems Denies nausea, vomiting, abdominal pain. No scrotal swelling  Past Medical History:  Diagnosis Date  . ACL sprain 06/2015  . Allergy    RHINITIS  . Atypical nevus 2007   dr Gretta Cool, saw him ~ 04-2013  . Congenital deafness   . Ganglion   . Hyperlipidemia     Past Surgical History:  Procedure Laterality Date  . APPENDECTOMY    . GANGLION CYST EXCISION Right    2 times  . ROTATOR CUFF REPAIR Bilateral 2009  . TONSILLECTOMY  1961  . VASECTOMY      Social History   Social History  . Marital status: Married    Spouse name: N/A  . Number of children: 2  . Years of education: N/A   Occupational History  . COMPUTER PROGRAMMER    Social History Main Topics  . Smoking status: Current Some Day Smoker    Types: Cigarettes, Cigars  . Smokeless tobacco: Never Used     Comment: occasional cigar  . Alcohol use 0.6 oz/week    1 Glasses of wine per week     Comment: socially   . Drug use: No  . Sexual activity: Not on file   Other Topics Concern  . Not on file   Social History Narrative    Communicate w/ pt via mychart or  #  [phone] ([facility]LAY PHONE)      Allergies as of 01/15/2017      Reactions   Erythromycin Nausea And Vomiting      Medication List       Accurate as of 01/15/17 11:59 PM. Always use your most recent med list.          ALPRAZolam 0.5 MG tablet Commonly known as:  XANAX Take 0.5-1 tablets (0.25-0.5 mg total) by mouth 2 (two) times daily as needed for anxiety.   atorvastatin 40 MG tablet Commonly known as:  LIPITOR Take 1 tablet (40 mg total) by mouth  daily.   diphenhydrAMINE 50 MG tablet Commonly known as:  BENADRYL Take 50 mg by mouth at bedtime as needed for sleep.   FISH OIL TRIPLE STRENGTH 1400 MG Caps Take by mouth.   fluticasone 50 MCG/ACT nasal spray Commonly known as:  FLONASE Place 2 sprays into both nostrils daily.   MULTIVITAMIN ADULT PO Take 1 tablet by mouth daily.   ranitidine 150 MG tablet Commonly known as:  ZANTAC Take 150 mg by mouth at bedtime.   sildenafil 100 MG tablet Commonly known as:  VIAGRA Take 0.5-1 tablets (50-100 mg total) by mouth daily as needed for erectile dysfunction.          Objective:   Physical Exam  Abdominal:     BP 116/62 (BP Location: Right Arm, Patient Position: Sitting, Cuff Size: Normal)   Pulse 62   Temp 98.2 F (36.8 C) (Oral)   Resp 14   Ht 5\' 8"  (1.727 m)   Wt 180 lb 8 oz (81.9 kg)   SpO2 98%   BMI 27.44 kg/m  General:  Well developed, well nourished . NAD.  HEENT:  Normocephalic . Face symmetric, atraumatic Abdomen:  Not distended, soft, non-tender. No rebound or rigidity. Has a reducible mass, not tender, on the right side. See graphic GU: Scrotal contents normal, penis normal. Skin: Not pale. Not jaundice Neurologic:  alert & oriented X3.  Speech normal, gait appropriate for age and unassisted Psych--  Cognition and judgment appear intact.  Cooperative with normal attention span and concentration.  Behavior appropriate. No anxious or depressed appearing.    Assessment & Plan:   Assessment Prediabetes: A1c 6.3 and this is 2015) Hyperlipidemia Congenital deafness GERD Atypical nevus, Dr. Gretta Cool 2014 H/o gross hematuria 2016 + FH hypertrophic cardiomyopathy, echo (-) 07-2014  Plan: Inguinal hernia: Incarceration sxs discuss, refer to surgery.

## 2017-01-17 NOTE — Assessment & Plan Note (Signed)
Inguinal hernia: Incarceration sxs discuss, refer to surgery.

## 2017-03-24 HISTORY — PX: HERNIA REPAIR: SHX51

## 2017-06-10 ENCOUNTER — Other Ambulatory Visit: Payer: Self-pay | Admitting: Internal Medicine

## 2017-08-16 ENCOUNTER — Other Ambulatory Visit: Payer: Self-pay | Admitting: Internal Medicine

## 2017-08-16 ENCOUNTER — Encounter: Payer: Self-pay | Admitting: Internal Medicine

## 2017-08-16 MED ORDER — SILDENAFIL CITRATE 100 MG PO TABS: 50.0000 mg | ORAL_TABLET | Freq: Every day | ORAL | 0 refills | Status: DC | PRN

## 2017-09-20 ENCOUNTER — Other Ambulatory Visit: Payer: Self-pay

## 2017-09-21 ENCOUNTER — Encounter: Payer: Self-pay | Admitting: Internal Medicine

## 2017-09-21 ENCOUNTER — Ambulatory Visit (INDEPENDENT_AMBULATORY_CARE_PROVIDER_SITE_OTHER): Payer: BLUE CROSS/BLUE SHIELD | Admitting: Internal Medicine

## 2017-09-21 VITALS — BP 124/70 | HR 63 | Temp 98.2°F | Resp 14 | Ht 68.0 in | Wt 180.1 lb

## 2017-09-21 DIAGNOSIS — G4700 Insomnia, unspecified: Secondary | ICD-10-CM | POA: Diagnosis not present

## 2017-09-21 DIAGNOSIS — R739 Hyperglycemia, unspecified: Secondary | ICD-10-CM | POA: Diagnosis not present

## 2017-09-21 DIAGNOSIS — Z0000 Encounter for general adult medical examination without abnormal findings: Secondary | ICD-10-CM

## 2017-09-21 DIAGNOSIS — Z79899 Other long term (current) drug therapy: Secondary | ICD-10-CM | POA: Diagnosis not present

## 2017-09-21 LAB — CBC WITH DIFFERENTIAL/PLATELET
BASOS ABS: 0 10*3/uL (ref 0.0–0.1)
Basophils Relative: 0.5 % (ref 0.0–3.0)
EOS PCT: 2.7 % (ref 0.0–5.0)
Eosinophils Absolute: 0.2 10*3/uL (ref 0.0–0.7)
HEMATOCRIT: 46.1 % (ref 39.0–52.0)
Hemoglobin: 15.4 g/dL (ref 13.0–17.0)
LYMPHS PCT: 15.5 % (ref 12.0–46.0)
Lymphs Abs: 1.1 10*3/uL (ref 0.7–4.0)
MCHC: 33.5 g/dL (ref 30.0–36.0)
MCV: 91 fl (ref 78.0–100.0)
MONOS PCT: 6.8 % (ref 3.0–12.0)
Monocytes Absolute: 0.5 10*3/uL (ref 0.1–1.0)
Neutro Abs: 5.1 10*3/uL (ref 1.4–7.7)
Neutrophils Relative %: 74.5 % (ref 43.0–77.0)
PLATELETS: 275 10*3/uL (ref 150.0–400.0)
RBC: 5.06 Mil/uL (ref 4.22–5.81)
RDW: 12.8 % (ref 11.5–15.5)
WBC: 6.8 10*3/uL (ref 4.0–10.5)

## 2017-09-21 LAB — COMPREHENSIVE METABOLIC PANEL
ALBUMIN: 4.4 g/dL (ref 3.5–5.2)
ALK PHOS: 56 U/L (ref 39–117)
ALT: 26 U/L (ref 0–53)
AST: 22 U/L (ref 0–37)
BILIRUBIN TOTAL: 0.5 mg/dL (ref 0.2–1.2)
BUN: 20 mg/dL (ref 6–23)
CALCIUM: 9.3 mg/dL (ref 8.4–10.5)
CO2: 32 meq/L (ref 19–32)
CREATININE: 0.92 mg/dL (ref 0.40–1.50)
Chloride: 104 mEq/L (ref 96–112)
GFR: 89.28 mL/min (ref >60.00)
Glucose, Bld: 119 mg/dL — ABNORMAL HIGH (ref 70–99)
Potassium: 4.8 mEq/L (ref 3.5–5.1)
Sodium: 143 mEq/L (ref 135–145)
TOTAL PROTEIN: 7 g/dL (ref 6.0–8.3)

## 2017-09-21 LAB — LIPID PANEL
CHOL/HDL RATIO: 2
CHOLESTEROL: 167 mg/dL (ref 0–200)
HDL: 69.3 mg/dL (ref >39.00)
LDL Cholesterol: 88 mg/dL (ref 0–99)
NonHDL: 97.91
Triglycerides: 52 mg/dL (ref 0.0–149.0)
VLDL: 10.4 mg/dL (ref 0.0–40.0)

## 2017-09-21 LAB — HEMOGLOBIN A1C: Hgb A1c MFr Bld: 5.9 % (ref 4.6–6.5)

## 2017-09-21 NOTE — Progress Notes (Signed)
Subjective:    Patient ID: Brian Gibson, male    DOB: 03/14/1956, 62 y.o.   MRN: [id number]  DOS:  09/21/2017 Type of visit - description : cpx, here w/ a interpreter  Interval history: no major concerns   Review of Systems  A 14 point review of systems is negative   Past Medical History:  Diagnosis Date  . ACL sprain 06/2015  . Allergy    RHINITIS  . Atypical nevus 2007   dr Gretta Cool, saw him ~ 04-2013  . Congenital deafness   . Ganglion   . Hyperlipidemia     Past Surgical History:  Procedure Laterality Date  . APPENDECTOMY    . GANGLION CYST EXCISION Right    2 times  . HERNIA REPAIR Right 03/2017  . ROTATOR CUFF REPAIR Bilateral 2009  . TONSILLECTOMY  1961  . VASECTOMY      Social History   Socioeconomic History  . Marital status: Legally Separated    Spouse name: Not on file  . Number of children: 2  . Years of education: Not on file  . Highest education level: Not on file  Social Needs  . Financial resource strain: Not on file  . Food insecurity - worry: Not on file  . Food insecurity - inability: Not on file  . Transportation needs - medical: Not on file  . Transportation needs - non-medical: Not on file  Occupational History  . Occupation: COMPUTER PROGRAMMER  Tobacco Use  . Smoking status: Current Some Day Smoker    Types: Cigarettes, Cigars  . Smokeless tobacco: Never Used  . Tobacco comment: occasional cigar  Substance and Sexual Activity  . Alcohol use: Yes    Alcohol/week: 0.6 oz    Types: 1 Glasses of wine per week    Comment: socially   . Drug use: No  . Sexual activity: Not on file  Other Topics Concern  . Not on file  Social History Narrative    Communicate w/ pt via mychart or  #  [phone] ([facility] PHONE)     Family History  Problem Relation Age of Onset  . Hypertension Mother        alive at 53 y/o  . Hypertension Father        alive at 68 y/o  . Transient ischemic attack Father   . Prostate cancer Father 78  .  Cardiomyopathy Sister        hyperthrophic   . Cancer Neg Hx   . Diabetes Neg Hx   . Stroke Neg Hx   . CAD Neg Hx   . Colon cancer Neg Hx      Allergies as of 09/21/2017      Reactions   Erythromycin Nausea And Vomiting      Medication List        Accurate as of 09/21/17 11:59 PM. Always use your most recent med list.          ALPRAZolam 0.5 MG tablet Commonly known as:  XANAX Take 0.5-1 tablets (0.25-0.5 mg total) by mouth 2 (two) times daily as needed for anxiety.   atorvastatin 40 MG tablet Commonly known as:  LIPITOR Take 1 tablet (40 mg total) by mouth daily.   diphenhydrAMINE 50 MG tablet Commonly known as:  BENADRYL Take 50 mg by mouth at bedtime as needed for sleep.   fluticasone 50 MCG/ACT nasal spray Commonly known as:  FLONASE Place 2 sprays into both nostrils daily.   MULTIVITAMIN ADULT  PO Take 1 tablet by mouth daily.   sildenafil 100 MG tablet Commonly known as:  VIAGRA Take 0.5-1 tablets (50-100 mg total) by mouth daily as needed for erectile dysfunction.          Objective:   Physical Exam BP 124/70 (BP Location: Left Arm, Patient Position: Sitting, Cuff Size: Small)   Pulse 63   Temp 98.2 F (36.8 C) (Oral)   Resp 14   Ht 5\' 8"  (1.727 m)   Wt 180 lb 2 oz (81.7 kg)   SpO2 98%   BMI 27.39 kg/m  General:   Well developed, well nourished . NAD.  Neck: No  thyromegaly  HEENT:  Normocephalic . Face symmetric, atraumatic Lungs:  CTA B Normal respiratory effort, no intercostal retractions, no accessory muscle use. Heart: RRR,  no murmur.  No pretibial edema bilaterally  Abdomen:  Not distended, soft, non-tender. No rebound or rigidity.   Skin: Exposed areas without rash. Not pale. Not jaundice Neurologic:  alert & oriented X3.  Speech normal, gait appropriate for age and unassisted Strength symmetric and appropriate for age.  Psych: Cognition and judgment appear intact.  Cooperative with normal attention span and concentration.    Behavior appropriate. No anxious or depressed appearing.     Assessment & Plan:   Assessment Prediabetes: A1c 6.3 and this is 2015) Hyperlipidemia Anxiety- insomnia- xanax Congenital deafness GERD Atypical nevus, Dr. Gretta Cool 2014, sees derm q year as off 08-2017 H/o gross hematuria 2016 + FH hypertrophic cardiomyopathy, echo (-) 07-2014  Plan: Prediabetes: Check a A1c Hyperlipidemia: On Lipitor, check labs Anxiety, Insomnia: Takes Xanax, mostly at nights, not every night. Social: Recently separated from his wife. RTC  1 year

## 2017-09-21 NOTE — Assessment & Plan Note (Addendum)
-  Td  01-2013; reports had shingrix #1 ~ 12-2016[facility], has not being able to get #2; offered a flu shot: declined -[facility]: Cscope 11-09---> 4 mm polyp, not recovered Cscope again 2-15, tics, no polyps, 10 years -Prostate cancer screening: DRE -PSA wnl 2018 -Labs: CMP, FLP, CBC, A1c, UDS -diet and physically active-- counseled

## 2017-09-21 NOTE — Progress Notes (Signed)
Pre visit review using our clinic review tool, if applicable. No additional management support is needed unless otherwise documented below in the visit note. 

## 2017-09-21 NOTE — Patient Instructions (Signed)
GO TO THE LAB : Get the blood work     GO TO THE FRONT DESK Schedule your next appointment for a  Physical exam in 1 year 

## 2017-09-22 NOTE — Assessment & Plan Note (Signed)
Prediabetes: Check a A1c Hyperlipidemia: On Lipitor, check labs Anxiety, Insomnia: Takes Xanax, mostly at nights, not every night. Social: Recently separated from his wife. RTC  1 year

## 2017-09-25 LAB — PAIN MGMT, PROFILE 8 W/CONF, U
6 Acetylmorphine: NEGATIVE ng/mL (ref <10)
ALCOHOL METABOLITES: POSITIVE ng/mL — AB (ref <500)
ALPHAHYDROXYMIDAZOLAM: NEGATIVE ng/mL (ref <50)
ALPHAHYDROXYTRIAZOLAM: NEGATIVE ng/mL (ref <50)
Alphahydroxyalprazolam: 36 ng/mL — ABNORMAL HIGH (ref <25)
Aminoclonazepam: NEGATIVE ng/mL (ref <25)
Amphetamines: NEGATIVE ng/mL (ref <500)
Benzodiazepines: POSITIVE ng/mL — AB (ref <100)
Buprenorphine, Urine: NEGATIVE ng/mL (ref <5)
COCAINE METABOLITE: NEGATIVE ng/mL (ref <150)
CREATININE: 87.5 mg/dL
ETHYL GLUCURONIDE (ETG): 2283 ng/mL — AB (ref <500)
ETHYL SULFATE (ETS): 888 ng/mL — AB (ref <100)
Hydroxyethylflurazepam: NEGATIVE ng/mL (ref <50)
LORAZEPAM: NEGATIVE ng/mL (ref <50)
MDMA: NEGATIVE ng/mL (ref <500)
Marijuana Metabolite: NEGATIVE ng/mL (ref <20)
NORDIAZEPAM: NEGATIVE ng/mL (ref <50)
OPIATES: NEGATIVE ng/mL (ref <100)
OXAZEPAM: NEGATIVE ng/mL (ref <50)
OXYCODONE: NEGATIVE ng/mL (ref <100)
Oxidant: NEGATIVE ug/mL (ref <200)
PH: 7.16 (ref 4.5–9.0)
Temazepam: NEGATIVE ng/mL (ref <50)

## 2017-11-05 ENCOUNTER — Encounter: Payer: Self-pay | Admitting: Internal Medicine

## 2017-11-22 ENCOUNTER — Other Ambulatory Visit: Payer: Self-pay

## 2017-11-22 MED ORDER — ATORVASTATIN CALCIUM 40 MG PO TABS: 40.0000 mg | ORAL_TABLET | Freq: Every day | ORAL | 2 refills | Status: DC

## 2017-12-08 DIAGNOSIS — S61431A Puncture wound without foreign body of right hand, initial encounter: Secondary | ICD-10-CM | POA: Diagnosis not present

## 2018-01-05 ENCOUNTER — Encounter: Payer: Self-pay | Admitting: Family Medicine

## 2018-01-05 ENCOUNTER — Ambulatory Visit (INDEPENDENT_AMBULATORY_CARE_PROVIDER_SITE_OTHER): Payer: BLUE CROSS/BLUE SHIELD | Admitting: Family Medicine

## 2018-01-05 VITALS — BP 122/80 | HR 76 | Ht 68.0 in | Wt 180.5 lb

## 2018-01-05 DIAGNOSIS — Z7289 Other problems related to lifestyle: Secondary | ICD-10-CM | POA: Insufficient documentation

## 2018-01-05 DIAGNOSIS — J301 Allergic rhinitis due to pollen: Secondary | ICD-10-CM | POA: Diagnosis not present

## 2018-01-05 DIAGNOSIS — Z789 Other specified health status: Secondary | ICD-10-CM | POA: Diagnosis not present

## 2018-01-05 DIAGNOSIS — F1090 Alcohol use, unspecified, uncomplicated: Secondary | ICD-10-CM | POA: Insufficient documentation

## 2018-01-05 DIAGNOSIS — F321 Major depressive disorder, single episode, moderate: Secondary | ICD-10-CM

## 2018-01-05 DIAGNOSIS — F5102 Adjustment insomnia: Secondary | ICD-10-CM | POA: Diagnosis not present

## 2018-01-05 MED ORDER — TRAZODONE HCL 50 MG PO TABS: 50.0000 mg | ORAL_TABLET | Freq: Every day | ORAL | 0 refills | Status: DC

## 2018-01-05 MED ORDER — PAROXETINE HCL ER 25 MG PO TB24: 25.0000 mg | ORAL_TABLET | Freq: Every day | ORAL | 1 refills | Status: DC

## 2018-01-05 MED ORDER — FLUTICASONE PROPIONATE 50 MCG/ACT NA SUSP: 2.0000 | Freq: Every day | NASAL | 3 refills | Status: AC

## 2018-01-05 NOTE — Patient Instructions (Signed)
Allergic Rhinitis, Adult Allergic rhinitis is an allergic reaction that affects the mucous membrane inside the nose. It causes sneezing, a runny or stuffy nose, and the feeling of mucus going down the back of the throat (postnasal drip). Allergic rhinitis can be mild to severe. There are two types of allergic rhinitis:  Seasonal. This type is also called hay fever. It happens only during certain seasons.  Perennial. This type can happen at any time of the year.  What are the causes? This condition happens when the body's defense system (immune system) responds to certain harmless substances called allergens as though they were germs.  Seasonal allergic rhinitis is triggered by pollen, which can come from grasses, trees, and weeds. Perennial allergic rhinitis may be caused by:  House dust mites.  Pet dander.  Mold spores.  What are the signs or symptoms? Symptoms of this condition include:  Sneezing.  Runny or stuffy nose (nasal congestion).  Postnasal drip.  Itchy nose.  Tearing of the eyes.  Trouble sleeping.  Daytime sleepiness.  How is this diagnosed? This condition may be diagnosed based on:  Your medical history.  A physical exam.  Tests to check for related conditions, such as: ? Asthma. ? Pink eye. ? Ear infection. ? Upper respiratory infection.  Tests to find out which allergens trigger your symptoms. These may include skin or blood tests.  How is this treated? There is no cure for this condition, but treatment can help control symptoms. Treatment may include:  Taking medicines that block allergy symptoms, such as antihistamines. Medicine may be given as a shot, nasal spray, or pill.  Avoiding the allergen.  Desensitization. This treatment involves getting ongoing shots until your body becomes less sensitive to the allergen. This treatment may be done if other treatments do not help.  If taking medicine and avoiding the allergen does not work, new,  stronger medicines may be prescribed.  Follow these instructions at home:  Find out what you are allergic to. Common allergens include smoke, dust, and pollen.  Avoid the things you are allergic to. These are some things you can do to help avoid allergens: ? Replace carpet with wood, tile, or vinyl flooring. Carpet can trap dander and dust. ? Do not smoke. Do not allow smoking in your home. ? Change your heating and air conditioning filter at least once a month. ? During allergy season:  Keep windows closed as much as possible.  Plan outdoor activities when pollen counts are lowest. This is usually during the evening hours.  When coming indoors, change clothing and shower before sitting on furniture or bedding.  Take over-the-counter and prescription medicines only as told by your health care provider.  Keep all follow-up visits as told by your health care provider. This is important. Contact a health care provider if:  You have a fever.  You develop a persistent cough.  You make whistling sounds when you breathe (you wheeze).  Your symptoms interfere with your normal daily activities. Get help right away if:  You have shortness of breath. Summary  This condition can be managed by taking medicines as directed and avoiding allergens.  Contact your health care provider if you develop a persistent cough or fever.  During allergy season, keep windows closed as much as possible. This information is not intended to replace advice given to you by your health care provider. Make sure you discuss any questions you have with your health care provider. Document Released: 05/05/2001 Document Revised: 09/17/2016  Document Reviewed: 09/17/2016 Elsevier Interactive Patient Education  2018 [place] With Depression Everyone experiences occasional disappointment, sadness, and loss in their lives. When you are feeling down, blue, or sad for at least 2 weeks in a row, it may  mean that you have depression. Depression can affect your thoughts and feelings, relationships, daily activities, and physical health. It is caused by changes in the way your brain functions. If you receive a diagnosis of depression, your health care provider will tell you which type of depression you have and what treatment options are available to you. If you are living with depression, there are ways to help you recover from it and also ways to prevent it from coming back. How to cope with lifestyle changes Coping with stress Stress is your body's reaction to life changes and events, both good and bad. Stressful situations may include:  Getting married.  The death of a spouse.  Losing a job.  Retiring.  Having a baby.  Stress can last just a few hours or it can be ongoing. Stress can play a major role in depression, so it is important to learn both how to cope with stress and how to think about it differently. Talk with your health care provider or a counselor if you would like to learn more about stress reduction. He or she may suggest some stress reduction techniques, such as:  Music therapy. This can include creating music or listening to music. Choose music that you enjoy and that inspires you.  Mindfulness-based meditation. This kind of meditation can be done while sitting or walking. It involves being aware of your normal breaths, rather than trying to control your breathing.  Centering prayer. This is a kind of meditation that involves focusing on a spiritual word or phrase. Choose a word, phrase, or sacred image that is meaningful to you and that brings you peace.  Deep breathing. To do this, expand your stomach and inhale slowly through your nose. Hold your breath for 3-5 seconds, then exhale slowly, allowing your stomach muscles to relax.  Muscle relaxation. This involves intentionally tensing muscles then relaxing them.  Choose a stress reduction technique that fits your  lifestyle and personality. Stress reduction techniques take time and practice to develop. Set aside 5-15 minutes a day to do them. Therapists can offer training in these techniques. The training may be covered by some insurance plans. Other things you can do to manage stress include:  Keeping a stress diary. This can help you learn what triggers your stress and ways to control your response.  Understanding what your limits are and saying no to requests or events that lead to a schedule that is too full.  Thinking about how you respond to certain situations. You may not be able to control everything, but you can control how you react.  Adding humor to your life by watching funny films or TV shows.  Making time for activities that help you relax and not feeling guilty about spending your time this way.  Medicines Your health care provider may suggest certain medicines if he or she feels that they will help improve your condition. Avoid using alcohol and other substances that may prevent your medicines from working properly (may interact). It is also important to:  Talk with your pharmacist or health care provider about all the medicines that you take, their possible side effects, and what medicines are safe to take together.  Make it your goal to take part  in all treatment decisions (shared decision-making). This includes giving input on the side effects of medicines. It is best if shared decision-making with your health care provider is part of your total treatment plan.  If your health care provider prescribes a medicine, you may not notice the full benefits of it for 4-8 weeks. Most people who are treated for depression need to be on medicine for at least 6-12 months after they feel better. If you are taking medicines as part of your treatment, do not stop taking medicines without first talking to your health care provider. You may need to have the medicine slowly decreased (tapered) over time to  decrease the risk of harmful side effects. Relationships Your health care provider may suggest family therapy along with individual therapy and drug therapy. While there may not be family problems that are causing you to feel depressed, it is still important to make sure your family learns as much as they can about your mental health. Having your family's support can help make your treatment successful. How to recognize changes in your condition Everyone has a different response to treatment for depression. Recovery from major depression happens when you have not had signs of major depression for two months. This may mean that you will start to:  Have more interest in doing activities.  Feel less hopeless than you did 2 months ago.  Have more energy.  Overeat less often, or have better or improving appetite.  Have better concentration.  Your health care provider will work with you to decide the next steps in your recovery. It is also important to recognize when your condition is getting worse. Watch for these signs:  Having fatigue or low energy.  Eating too much or too little.  Sleeping too much or too little.  Feeling restless, agitated, or hopeless.  Having trouble concentrating or making decisions.  Having unexplained physical complaints.  Feeling irritable, angry, or aggressive.  Get help as soon as you or your family members notice these symptoms coming back. How to get support and help from others How to talk with friends and family members about your condition Talking to friends and family members about your condition can provide you with one way to get support and guidance. Reach out to trusted friends or family members, explain your symptoms to them, and let them know that you are working with a health care provider to treat your depression. Financial resources Not all insurance plans cover mental health care, so it is important to check with your insurance carrier. If  paying for co-pays or counseling services is a problem, search for a local or county mental health care center. They may be able to offer public mental health care services at low or no cost when you are not able to see a private health care provider. If you are taking medicine for depression, you may be able to get the generic form, which may be less expensive. Some makers of prescription medicines also offer help to patients who cannot afford the medicines they need. Follow these instructions at home:  Get the right amount and quality of sleep.  Cut down on using caffeine, tobacco, alcohol, and other potentially harmful substances.  Try to exercise, such as walking or lifting small weights.  Take over-the-counter and prescription medicines only as told by your health care provider.  Eat a healthy diet that includes plenty of vegetables, fruits, whole grains, low-fat dairy products, and lean protein. Do not eat a lot  of foods that are high in solid fats, added sugars, or salt.  Keep all follow-up visits as told by your health care provider. This is important. Contact a health care provider if:  You stop taking your antidepressant medicines, and you have any of these symptoms: ? Nausea. ? Headache. ? Feeling lightheaded. ? Chills and body aches. ? Not being able to sleep (insomnia).  You or your friends and family think your depression is getting worse. Get help right away if:  You have thoughts of hurting yourself or others. If you ever feel like you may hurt yourself or others, or have thoughts about taking your own life, get help right away. You can go to your nearest emergency department or call:  Your local emergency services (911 in the [place]  A suicide crisis helpline, such as the [facility] at 3808273567. This is open 24-hours a day.  Summary  If you are living with depression, there are ways to help you recover from it and also ways to  prevent it from coming back.  Work with your health care team to create a management plan that includes counseling, stress management techniques, and healthy lifestyle habits. This information is not intended to replace advice given to you by your health care provider. Make sure you discuss any questions you have with your health care provider. Document Released: 07/13/2016 Document Revised: 07/13/2016 Document Reviewed: 07/13/2016 Elsevier Interactive Patient Education  2018 [employer]. Insomnia Insomnia is a sleep disorder that makes it difficult to fall asleep or to stay asleep. Insomnia can cause tiredness (fatigue), low energy, difficulty concentrating, mood swings, and poor performance at work or school. There are three different ways to classify insomnia:  Difficulty falling asleep.  Difficulty staying asleep.  Waking up too early in the morning.  Any type of insomnia can be long-term (chronic) or short-term (acute). Both are common. Short-term insomnia usually lasts for three months or less. Chronic insomnia occurs at least three times a week for longer than three months. What are the causes? Insomnia may be caused by another condition, situation, or substance, such as:  Anxiety.  Certain medicines.  Gastroesophageal reflux disease (GERD) or other gastrointestinal conditions.  Asthma or other breathing conditions.  Restless legs syndrome, sleep apnea, or other sleep disorders.  Chronic pain.  Menopause. This may include hot flashes.  Stroke.  Abuse of alcohol, tobacco, or illegal drugs.  Depression.  Caffeine.  Neurological disorders, such as Alzheimer disease.  An overactive thyroid (hyperthyroidism).  The cause of insomnia may not be known. What increases the risk? Risk factors for insomnia include:  Gender. Women are more commonly affected than men.  Age. Insomnia is more common as you get older.  Stress. This may involve your professional or  personal life.  Income. Insomnia is more common in people with lower income.  Lack of exercise.  Irregular work schedule or night shifts.  Traveling between different time zones.  What are the signs or symptoms? If you have insomnia, trouble falling asleep or trouble staying asleep is the main symptom. This may lead to other symptoms, such as:  Feeling fatigued.  Feeling nervous about going to sleep.  Not feeling rested in the morning.  Having trouble concentrating.  Feeling irritable, anxious, or depressed.  How is this treated? Treatment for insomnia depends on the cause. If your insomnia is caused by an underlying condition, treatment will focus on addressing the condition. Treatment may also include:  Medicines to help you  sleep.  Counseling or therapy.  Lifestyle adjustments.  Follow these instructions at home:  Take medicines only as directed by your health care provider.  Keep regular sleeping and waking hours. Avoid naps.  Keep a sleep diary to help you and your health care provider figure out what could be causing your insomnia. Include: ? When you sleep. ? When you wake up during the night. ? How well you sleep. ? How rested you feel the next day. ? Any side effects of medicines you are taking. ? What you eat and drink.  Make your bedroom a comfortable place where it is easy to fall asleep: ? Put up shades or special blackout curtains to block light from outside. ? Use a white noise machine to block noise. ? Keep the temperature cool.  Exercise regularly as directed by your health care provider. Avoid exercising right before bedtime.  Use relaxation techniques to manage stress. Ask your health care provider to suggest some techniques that may work well for you. These may include: ? Breathing exercises. ? Routines to release muscle tension. ? Visualizing peaceful scenes.  Cut back on alcohol, caffeinated beverages, and cigarettes, especially close to  bedtime. These can disrupt your sleep.  Do not overeat or eat spicy foods right before bedtime. This can lead to digestive discomfort that can make it hard for you to sleep.  Limit screen use before bedtime. This includes: ? Watching TV. ? Using your smartphone, tablet, and computer.  Stick to a routine. This can help you fall asleep faster. Try to do a quiet activity, brush your teeth, and go to bed at the same time each night.  Get out of bed if you are still awake after 15 minutes of trying to sleep. Keep the lights down, but try reading or doing a quiet activity. When you feel sleepy, go back to bed.  Make sure that you drive carefully. Avoid driving if you feel very sleepy.  Keep all follow-up appointments as directed by your health care provider. This is important. Contact a health care provider if:  You are tired throughout the day or have trouble in your daily routine due to sleepiness.  You continue to have sleep problems or your sleep problems get worse. Get help right away if:  You have serious thoughts about hurting yourself or someone else. This information is not intended to replace advice given to you by your health care provider. Make sure you discuss any questions you have with your health care provider. Document Released: 08/07/2000 Document Revised: 01/10/2016 Document Reviewed: 05/11/2014 Elsevier Interactive Patient Education  2018 [employer]. Paroxetine Controlled-Release Tablets What is this medicine? PAROXETINE (pa ROX e teen) is used to treat depression. It may also be used to treat anxiety disorders, obsessive compulsive disorder, panic attacks, post traumatic stress, and premenstrual dysphoric disorder (PMDD). This medicine may be used for other purposes; ask your health care provider or pharmacist if you have questions. COMMON BRAND NAME(S): Paxil CR What should I tell my health care provider before I take this medicine? They need to know if you have any  of these conditions: -bipolar disorder or a family history of bipolar disorder -bleeding disorders -glaucoma -heart disease -kidney disease -liver disease -low levels of sodium in the blood -seizures -suicidal thoughts, plans, or attempt; a previous suicide attempt by you or a family member -take MAOIs like Carbex, Eldepryl, Marplan, Nardil, and Parnate -take medicines that treat or prevent blood clots -thyroid disease -an unusual or allergic  reaction to paroxetine, other medicines, foods, dyes, or preservatives -pregnant or trying to get pregnant -breast-feeding How should I use this medicine? Take this medicine by mouth with a glass of water. Follow the directions on the prescription label. You can take it with or without food. Do not crush or chew this medicine. Take your medicine at regular intervals. Do not take your medicine more often than directed. Do not stop taking this medicine suddenly except upon the advice of your doctor. Stopping this medicine too quickly may cause serious side effects or your condition may worsen. A special MedGuide will be given to you by the pharmacist with each prescription and refill. Be sure to read this information carefully each time. Talk to your pediatrician regarding the use of this medicine in children. Special care may be needed. Overdosage: If you think you have taken too much of this medicine contact a poison control center or emergency room at once. NOTE: This medicine is only for you. Do not share this medicine with others. What if I miss a dose? If you miss a dose, take it as soon as you can. If it is almost time for your next dose, take only that dose. Do not take double or extra doses. What may interact with this medicine? Do not take this medicine with any of the following medications: -linezolid -MAOIs like Carbex, Eldepryl, Marplan, Nardil, and Parnate -methylene blue (injected into a vein) -pimozide -thioridazine This medicine may  also interact with the following medications: -alcohol -amphetamines -aspirin and aspirin-like medicines -atomoxetine -certain medicines for depression, anxiety, or psychotic disturbances -certain medicines for irregular heart beat like propafenone, flecainide, encainide, and quinidine -certain medicines for migraine headache like almotriptan, eletriptan, frovatriptan, naratriptan, rizatriptan, sumatriptan, zolmitriptan -cimetidine -digoxin -diuretics -fentanyl -fosamprenavir -furazolidone -isoniazid -lithium -medicines that treat or prevent blood clots like warfarin, enoxaparin, and dalteparin -medicines for sleep -NSAIDs, medicines for pain and inflammation, like ibuprofen or naproxen -phenobarbital -phenytoin -procarbazine -rasagiline -ritonavir -supplements like St. Antion's wort, kava kava, valerian -tamoxifen -tramadol -tryptophan This list may not describe all possible interactions. Give your health care provider a list of all the medicines, herbs, non-prescription drugs, or dietary supplements you use. Also tell them if you smoke, drink alcohol, or use illegal drugs. Some items may interact with your medicine. What should I watch for while using this medicine? Tell your doctor if your symptoms do not get better or if they get worse. Visit your doctor or health care professional for regular checks on your progress. Because it may take several weeks to see the full effects of this medicine, it is important to continue your treatment as prescribed by your doctor. Patients and their families should watch out for new or worsening thoughts of suicide or depression. Also watch out for sudden changes in feelings such as feeling anxious, agitated, panicky, irritable, hostile, aggressive, impulsive, severely restless, overly excited and hyperactive, or not being able to sleep. If this happens, especially at the beginning of treatment or after a change in dose, call your health care  professional. Dennis Bast may get drowsy or dizzy. Do not drive, use machinery, or do anything that needs mental alertness until you know how this medicine affects you. Do not stand or sit up quickly, especially if you are an older patient. This reduces the risk of dizzy or fainting spells. Alcohol may interfere with the effect of this medicine. Avoid alcoholic drinks. Your mouth may get dry. Chewing sugarless gum or sucking hard candy, and drinking plenty of  water will help. Contact your doctor if the problem does not go away or is severe. What side effects may I notice from receiving this medicine? Side effects that you should report to your doctor or health care professional as soon as possible: -allergic reactions like skin rash, itching or hives, swelling of the face, lips, or tongue -anxious -black, tarry stools -changes in vision -confusion -elevated mood, decreased need for sleep, racing thoughts, impulsive behavior -eye pain -fast, irregular heartbeat -feeling faint or lightheaded, falls -feeling agitated, angry, or irritable -hallucination, loss of contact with reality -loss of balance or coordination -loss of memory -painful or prolonged erections -restlessness, pacing, inability to keep still -seizures -stiff muscles -suicidal thoughts or other mood changes -trouble sleeping -unusual bleeding or bruising -unusually weak or tired -vomiting Side effects that usually do not require medical attention (report to your doctor or health care professional if they continue or are bothersome): -change in appetite or weight -change in sex drive or performance -diarrhea -dizziness -dry mouth -headache -increased sweating -indigestion, nausea -tired -tremors This list may not describe all possible side effects. Call your doctor for medical advice about side effects. You may report side effects to FDA at 1-800-FDA-1088. Where should I keep my medicine? Keep out of the reach of  children. Store at or below 25 degrees C (77 degrees F). Throw away any unused medicine after the expiration date. NOTE: This sheet is a summary. It may not cover all possible information. If you have questions about this medicine, talk to your doctor, pharmacist, or health care provider.  2018 Elsevier/Gold Standard (2016-01-11 15:48:13)

## 2018-01-05 NOTE — Progress Notes (Signed)
Subjective:  Patient ID: Brian Gibson, male    DOB: 1956/01/04  Age: 62 y.o. MRN: [id number]  CC: Establish Care   HPI Brian Gibson presents for establishment of care by transfer.  Patient is congenital deaf and is accompanied by a signer.  Patient lives alone.  He is a Dance movement psychotherapist for [employer].  He has 2 grown children.  He has a daughter who is planning on being married in December.  He admits to having 2 shots of bourbon nightly but feels as though he needs to cut down on his alcohol use and so he will not become an alcoholic.  He rarely smokes cigars.  He quit smoking 40 years ago.  He does not use illicit drugs.  He is an avid cyclist and has a goal of 1500 miles of view per year.  He is taking 40 mg of atorvastatin.  His last LDL measured back in January was 194.  He takes Flonase for seasonal allergy symptoms to include watery eyes postnasal drip and sneezing.  Drug is effective for him.  He notices the symptoms when he rides his bike often.  Significant social history he became separated from his wife back in October.  He tells me that there is no hope for reconciliation.  He is afraid that his wife will be unable to trust him.  His children are not currently speaking to him.  He tells of intermittent Xanax use for sleep over the last 2 years.  Chart review with a urine drug screen drawn back in January of this year shows an ETG of over 2000.  Outpatient Medications Prior to Visit  Medication Sig Dispense Refill  . atorvastatin (LIPITOR) 40 MG tablet Take 1 tablet (40 mg total) by mouth daily. 90 tablet 2  . diphenhydrAMINE (BENADRYL) 50 MG tablet Take 50 mg by mouth at bedtime as needed for sleep.    . Multiple Vitamins-Minerals (MULTIVITAMIN ADULT PO) Take 1 tablet by mouth daily.    . sildenafil (VIAGRA) 100 MG tablet Take 0.5-1 tablets (50-100 mg total) by mouth daily as needed for erectile dysfunction. 30 tablet 0  . ALPRAZolam (XANAX) 0.5 MG tablet Take 0.5-1  tablets (0.25-0.5 mg total) by mouth 2 (two) times daily as needed for anxiety. 180 tablet 1  . fluticasone (FLONASE) 50 MCG/ACT nasal spray Place 2 sprays into both nostrils daily. 48 g 3   No facility-administered medications prior to visit.     ROS Review of Systems  Constitutional: Negative for diaphoresis, fatigue, fever and unexpected weight change.  HENT: Positive for hearing loss, postnasal drip, rhinorrhea and sneezing.   Eyes: Positive for itching.  Respiratory: Negative.   Cardiovascular: Negative.   Gastrointestinal: Negative.   Endocrine: Negative for polyphagia and polyuria.  Genitourinary: Negative.   Musculoskeletal: Negative.   Skin: Negative for color change and rash.  Allergic/Immunologic: Negative for immunocompromised state.  Neurological: Negative.   Psychiatric/Behavioral: Positive for dysphoric mood and sleep disturbance. The patient is nervous/anxious.     Objective:  BP 122/80   Pulse 76   Ht 5\' 8"  (1.727 m)   Wt 180 lb 8 oz (81.9 kg)   SpO2 99%   BMI 27.44 kg/m   BP Readings from Last 3 Encounters:  01/05/18 122/80  09/21/17 124/70  01/15/17 116/62    Wt Readings from Last 3 Encounters:  01/05/18 180 lb 8 oz (81.9 kg)  09/21/17 180 lb 2 oz (81.7 kg)  01/15/17 180 lb 8  oz (81.9 kg)    Physical Exam  Constitutional: He is oriented to person, place, and time. He appears well-developed and well-nourished. No distress.  HENT:  Head: Normocephalic and atraumatic.  Right Ear: External ear normal.  Left Ear: External ear normal.  Mouth/Throat: Oropharynx is clear and moist.  Eyes: Right eye exhibits no discharge. Left eye exhibits no discharge. No scleral icterus.  Neck: No JVD present. No tracheal deviation present.  Pulmonary/Chest: Breath sounds normal.  Neurological: He is alert and oriented to person, place, and time.  Skin: Skin is warm and dry. He is not diaphoretic.  Psychiatric: He has a normal mood and affect. His behavior is  normal.   Depression screen [facility] 2/9 01/05/2018 09/21/2017 09/16/2015  Decreased Interest 2 0 0  Down, Depressed, Hopeless 1 0 0  PHQ - 2 Score 3 0 0  Altered sleeping 3 - -  Tired, decreased energy 1 - -  Change in appetite 0 - -  Feeling bad or failure about yourself  2 - -  Trouble concentrating 2 - -  Moving slowly or fidgety/restless 0 - -  Suicidal thoughts 1 - -  PHQ-9 Score 12 - -   With regards to suicidal thoughts patient is admitted that he had briefly considered jumping off a cliff while hiking this past weekend.  He knows that he would never do such a thing and self-harm is not occurred to him since that time.  Lab Results  Component Value Date   WBC 6.8 09/21/2017   HGB 15.4 09/21/2017   HCT 46.1 09/21/2017   PLT 275.0 09/21/2017   GLUCOSE 119 (H) 09/21/2017   CHOL 167 09/21/2017   TRIG 52.0 09/21/2017   HDL 69.30 09/21/2017   LDLCALC 88 09/21/2017   ALT 26 09/21/2017   AST 22 09/21/2017   NA 143 09/21/2017   K 4.8 09/21/2017   CL 104 09/21/2017   CREATININE 0.92 09/21/2017   BUN 20 09/21/2017   CO2 32 09/21/2017   TSH 1.42 09/16/2015   PSA 1.29 09/17/2016   HGBA1C 5.9 09/21/2017    Dg Chest 2 View  Result Date: 08/28/2014 CLINICAL DATA:  Cough for 1 month. EXAM: CHEST  2 VIEW COMPARISON:  12/13/2012. FINDINGS: Mediastinum and hilar structures normal. The lungs are clear. Mild left base pleural parenchymal thickening consistent with scarring . Heart size normal. No pleural effusion or pneumothorax. No acute bony abnormality . IMPRESSION: Mild left base pleural parenchymal thickening again noted consistent with scarring. No acute cardiopulmonary disease. Electronically Signed   By: Marcello Moores  Register   On: 08/28/2014 15:01    Assessment & Plan:   [place] was seen today for establish care.  Diagnoses and all orders for this visit:  Seasonal allergic rhinitis due to pollen -     fluticasone (FLONASE) 50 MCG/ACT nasal spray; Place 2 sprays into both nostrils  daily.  Depression, major, single episode, moderate (HCC) -     PARoxetine (PAXIL-CR) 25 MG 24 hr tablet; Take 1 tablet (25 mg total) by mouth at bedtime. As needed for sleep  Adjustment insomnia -     traZODone (DESYREL) 50 MG tablet; Take 1 tablet (50 mg total) by mouth at bedtime.  Alcohol use   I have discontinued Torrin P. Lusk's ALPRAZolam. I am also having him start on PARoxetine and traZODone. Additionally, I am having him maintain his Multiple Vitamins-Minerals (MULTIVITAMIN ADULT PO), diphenhydrAMINE, sildenafil, atorvastatin, and fluticasone.  Meds ordered this encounter  Medications  . fluticasone (FLONASE)  50 MCG/ACT nasal spray    Sig: Place 2 sprays into both nostrils daily.    Dispense:  48 g    Refill:  3    Pt requesting 90 day supply  . PARoxetine (PAXIL-CR) 25 MG 24 hr tablet    Sig: Take 1 tablet (25 mg total) by mouth at bedtime. As needed for sleep    Dispense:  30 tablet    Refill:  1  . traZODone (DESYREL) 50 MG tablet    Sig: Take 1 tablet (50 mg total) by mouth at bedtime.    Dispense:  30 tablet    Refill:  0  I think it is in this but patient's best interest to discontinue the Xanax, especially when there is reason to come be concerned about his alcohol usage in the face of depression.  Am starting him on Paxil.  Advised him that it will probably help him sleep.  Have given him low-dose trazodone to take as needed for sleep.  He will return in 4 weeks for follow-up.   Follow-up: Return in about 1 month (around 02/02/2018).  Libby Maw, MD

## 2018-01-13 ENCOUNTER — Encounter: Payer: Self-pay | Admitting: Family Medicine

## 2018-02-01 ENCOUNTER — Encounter: Payer: Self-pay | Admitting: Family Medicine

## 2018-02-01 ENCOUNTER — Ambulatory Visit (INDEPENDENT_AMBULATORY_CARE_PROVIDER_SITE_OTHER): Payer: BLUE CROSS/BLUE SHIELD | Admitting: Family Medicine

## 2018-02-01 VITALS — BP 130/80 | HR 68 | Ht 68.0 in | Wt 175.0 lb

## 2018-02-01 DIAGNOSIS — T887XXA Unspecified adverse effect of drug or medicament, initial encounter: Secondary | ICD-10-CM | POA: Diagnosis not present

## 2018-02-01 DIAGNOSIS — Z7184 Encounter for health counseling related to travel: Secondary | ICD-10-CM

## 2018-02-01 DIAGNOSIS — F321 Major depressive disorder, single episode, moderate: Secondary | ICD-10-CM

## 2018-02-01 DIAGNOSIS — Z7189 Other specified counseling: Secondary | ICD-10-CM

## 2018-02-01 MED ORDER — PAROXETINE HCL ER 25 MG PO TB24: 25.0000 mg | ORAL_TABLET | Freq: Every day | ORAL | 1 refills | Status: DC

## 2018-02-01 NOTE — Progress Notes (Signed)
Subjective:  Patient ID: Brian Gibson, male    DOB: [birth year]  Age: 62 y.o. MRN: [id number]  CC: Follow-up   HPI Brian Gibson presents for follow-up of his depression status post starting Paxil.  He is feeling much better with the Paxil and is taking it nightly.  His sleep is good and he feels as though his sadness and anxiety are starting to lift.  He is having some loose stool since starting the Paxil but feels as though it is tolerable at this point.  He stopped taking the trazodone because it affected his libido.  He uses Benadryl as needed for sleep.  He has tentative plans to travel to [place] via a cruise ship that will be stopping at a port there.  He is planning no extensive excursions into the countryside and should not be exposed to bodily fluids.  He wonders about immunizations that may be required.  Outpatient Medications Prior to Visit  Medication Sig Dispense Refill  . atorvastatin (LIPITOR) 40 MG tablet Take 1 tablet (40 mg total) by mouth daily. 90 tablet 2  . diphenhydrAMINE (BENADRYL) 50 MG tablet Take 50 mg by mouth at bedtime as needed for sleep.    . fluticasone (FLONASE) 50 MCG/ACT nasal spray Place 2 sprays into both nostrils daily. 48 g 3  . Multiple Vitamins-Minerals (MULTIVITAMIN ADULT PO) Take 1 tablet by mouth daily.    Marland Kitchen PARoxetine (PAXIL-CR) 25 MG 24 hr tablet Take 1 tablet (25 mg total) by mouth at bedtime. As needed for sleep 30 tablet 1  . sildenafil (VIAGRA) 100 MG tablet Take 0.5-1 tablets (50-100 mg total) by mouth daily as needed for erectile dysfunction. 30 tablet 0  . traZODone (DESYREL) 50 MG tablet Take 1 tablet (50 mg total) by mouth at bedtime. 30 tablet 0   No facility-administered medications prior to visit.     ROS Review of Systems  Constitutional: Negative for chills, fever and unexpected weight change.  HENT: Negative.   Eyes: Negative.   Respiratory: Negative.   Cardiovascular: Negative.   Gastrointestinal: Positive for diarrhea.  Negative for abdominal pain, constipation, nausea, rectal pain and vomiting.  Genitourinary: Negative.   Skin: Negative for pallor and rash.  Allergic/Immunologic: Negative for immunocompromised state.  Neurological: Negative for headaches.  Psychiatric/Behavioral: Negative for dysphoric mood, self-injury, sleep disturbance and suicidal ideas. The patient is not nervous/anxious.      Depression screen [facility] 2/9 02/01/2018 01/05/2018 09/21/2017  Decreased Interest 0 2 0  Down, Depressed, Hopeless 0 1 0  PHQ - 2 Score 0 3 0  Altered sleeping 0 3 -  Tired, decreased energy 0 1 -  Change in appetite 0 0 -  Feeling bad or failure about yourself  0 2 -  Trouble concentrating 1 2 -  Moving slowly or fidgety/restless 0 0 -  Suicidal thoughts 0 1 -  PHQ-9 Score 1 12 -     Objective:  BP 130/80   Pulse 68   Ht 5' 8"  (1.727 m)   Wt 175 lb (79.4 kg)   SpO2 98%   BMI 26.61 kg/m   BP Readings from Last 3 Encounters:  02/01/18 130/80  01/05/18 122/80  09/21/17 124/70    Wt Readings from Last 3 Encounters:  02/01/18 175 lb (79.4 kg)  01/05/18 180 lb 8 oz (81.9 kg)  09/21/17 180 lb 2 oz (81.7 kg)    Physical Exam  Constitutional: He is oriented to person, place, and time. He appears well-developed  and well-nourished. No distress.  HENT:  Head: Normocephalic and atraumatic.  Right Ear: External ear normal.  Left Ear: External ear normal.  Eyes: Right eye exhibits no discharge. Left eye exhibits no discharge. No scleral icterus.  Neck: No JVD present. No tracheal deviation present.  Pulmonary/Chest: Effort normal.  Neurological: He is alert and oriented to person, place, and time.  Skin: Skin is warm and dry. He is not diaphoretic.  Psychiatric: He has a normal mood and affect. His behavior is normal.    Lab Results  Component Value Date   WBC 6.8 09/21/2017   HGB 15.4 09/21/2017   HCT 46.1 09/21/2017   PLT 275.0 09/21/2017   GLUCOSE 119 (H) 09/21/2017   CHOL 167 09/21/2017     TRIG 52.0 09/21/2017   HDL 69.30 09/21/2017   LDLCALC 88 09/21/2017   ALT 26 09/21/2017   AST 22 09/21/2017   NA 143 09/21/2017   K 4.8 09/21/2017   CL 104 09/21/2017   CREATININE 0.92 09/21/2017   BUN 20 09/21/2017   CO2 32 09/21/2017   TSH 1.42 09/16/2015   PSA 1.29 09/17/2016   HGBA1C 5.9 09/21/2017    Dg Chest 2 View  Result Date: 08/28/2014 CLINICAL DATA:  Cough for 1 month. EXAM: CHEST  2 VIEW COMPARISON:  12/13/2012. FINDINGS: Mediastinum and hilar structures normal. The lungs are clear. Mild left base pleural parenchymal thickening consistent with scarring . Heart size normal. No pleural effusion or pneumothorax. No acute bony abnormality . IMPRESSION: Mild left base pleural parenchymal thickening again noted consistent with scarring. No acute cardiopulmonary disease. Electronically Signed   By: Marcello Moores  Register   On: 08/28/2014 15:01    Assessment & Plan:   Brian Gibson was seen today for follow-up.  Diagnoses and all orders for this visit:  Depression, major, single episode, moderate ([place])  Medication side effect   I have discontinued Brian Gibson's traZODone. I am also having him maintain his Multiple Vitamins-Minerals (MULTIVITAMIN ADULT PO), diphenhydrAMINE, sildenafil, atorvastatin, fluticasone, and PARoxetine.  No orders of the defined types were placed in this encounter.  Patient is responded well to the Paxil.  He will take it daily.  Follow-up visit in 3 months.  Will return sooner if his loose bowels do not correct themselves.  Immunizations suggested for travel to [place] with casual contact would include hepatitis A, MMR, and typhoid.  Follow-up: Return in about 3 months (around 05/04/2018).  Libby Maw, MD

## 2018-02-15 ENCOUNTER — Encounter: Payer: Self-pay | Admitting: Family Medicine

## 2018-02-16 NOTE — Telephone Encounter (Signed)
Patient came into office and dropped off forms that needs to be completed per his e-mail he sent you. Patient is requesting for forms to be faxed to [phone] once completed. I have left forms in Dr. Bebe Shaggy file in the front office.

## 2018-02-17 ENCOUNTER — Encounter: Payer: Self-pay | Admitting: Family Medicine

## 2018-03-24 DIAGNOSIS — D3122 Benign neoplasm of left retina: Secondary | ICD-10-CM | POA: Diagnosis not present

## 2018-03-24 DIAGNOSIS — H35371 Puckering of macula, right eye: Secondary | ICD-10-CM | POA: Diagnosis not present

## 2018-03-24 DIAGNOSIS — H35351 Cystoid macular degeneration, right eye: Secondary | ICD-10-CM | POA: Diagnosis not present

## 2018-03-24 DIAGNOSIS — H43813 Vitreous degeneration, bilateral: Secondary | ICD-10-CM | POA: Diagnosis not present

## 2018-03-24 DIAGNOSIS — H21341 Primary cyst of pars plana, right eye: Secondary | ICD-10-CM | POA: Diagnosis not present

## 2018-04-11 ENCOUNTER — Other Ambulatory Visit: Payer: Self-pay | Admitting: Internal Medicine

## 2018-04-11 MED ORDER — SILDENAFIL CITRATE 100 MG PO TABS: 50.0000 mg | ORAL_TABLET | Freq: Every day | ORAL | 1 refills | Status: DC | PRN

## 2018-04-18 DIAGNOSIS — X32XXXD Exposure to sunlight, subsequent encounter: Secondary | ICD-10-CM | POA: Diagnosis not present

## 2018-04-18 DIAGNOSIS — L570 Actinic keratosis: Secondary | ICD-10-CM | POA: Diagnosis not present

## 2018-04-18 DIAGNOSIS — Z1283 Encounter for screening for malignant neoplasm of skin: Secondary | ICD-10-CM | POA: Diagnosis not present

## 2018-04-18 DIAGNOSIS — L918 Other hypertrophic disorders of the skin: Secondary | ICD-10-CM | POA: Diagnosis not present

## 2018-04-18 DIAGNOSIS — D225 Melanocytic nevi of trunk: Secondary | ICD-10-CM | POA: Diagnosis not present

## 2018-04-22 DIAGNOSIS — H43391 Other vitreous opacities, right eye: Secondary | ICD-10-CM | POA: Diagnosis not present

## 2018-04-22 DIAGNOSIS — H43811 Vitreous degeneration, right eye: Secondary | ICD-10-CM | POA: Diagnosis not present

## 2018-04-22 DIAGNOSIS — H35371 Puckering of macula, right eye: Secondary | ICD-10-CM | POA: Diagnosis not present

## 2018-04-22 DIAGNOSIS — H21341 Primary cyst of pars plana, right eye: Secondary | ICD-10-CM | POA: Diagnosis not present

## 2018-09-22 ENCOUNTER — Encounter: Payer: BLUE CROSS/BLUE SHIELD | Admitting: Internal Medicine

## 2018-09-22 ENCOUNTER — Encounter: Payer: Self-pay | Admitting: Family Medicine

## 2018-09-22 ENCOUNTER — Ambulatory Visit (INDEPENDENT_AMBULATORY_CARE_PROVIDER_SITE_OTHER): Payer: BLUE CROSS/BLUE SHIELD | Admitting: Family Medicine

## 2018-09-22 VITALS — BP 124/80 | HR 70 | Ht 68.0 in | Wt 188.4 lb

## 2018-09-22 DIAGNOSIS — Z7289 Other problems related to lifestyle: Secondary | ICD-10-CM | POA: Diagnosis not present

## 2018-09-22 DIAGNOSIS — E785 Hyperlipidemia, unspecified: Secondary | ICD-10-CM

## 2018-09-22 DIAGNOSIS — Z789 Other specified health status: Secondary | ICD-10-CM

## 2018-09-22 DIAGNOSIS — Z0000 Encounter for general adult medical examination without abnormal findings: Secondary | ICD-10-CM | POA: Diagnosis not present

## 2018-09-22 DIAGNOSIS — F1020 Alcohol dependence, uncomplicated: Secondary | ICD-10-CM

## 2018-09-22 DIAGNOSIS — E559 Vitamin D deficiency, unspecified: Secondary | ICD-10-CM | POA: Diagnosis not present

## 2018-09-22 LAB — URINALYSIS, ROUTINE W REFLEX MICROSCOPIC
BILIRUBIN URINE: NEGATIVE
HGB URINE DIPSTICK: NEGATIVE
Ketones, ur: NEGATIVE
LEUKOCYTES UA: NEGATIVE
Nitrite: NEGATIVE
RBC / HPF: NONE SEEN (ref 0–?)
Specific Gravity, Urine: 1.01 (ref 1.000–1.030)
Total Protein, Urine: NEGATIVE
Urine Glucose: NEGATIVE
Urobilinogen, UA: 0.2 (ref 0.0–1.0)
pH: 6 (ref 5.0–8.0)

## 2018-09-22 LAB — COMPREHENSIVE METABOLIC PANEL
ALK PHOS: 55 U/L (ref 39–117)
ALT: 34 U/L (ref 0–53)
AST: 24 U/L (ref 0–37)
Albumin: 4.6 g/dL (ref 3.5–5.2)
BILIRUBIN TOTAL: 0.5 mg/dL (ref 0.2–1.2)
BUN: 22 mg/dL (ref 6–23)
CO2: 29 mEq/L (ref 19–32)
Calcium: 9.7 mg/dL (ref 8.4–10.5)
Chloride: 102 mEq/L (ref 96–112)
Creatinine, Ser: 0.9 mg/dL (ref 0.40–1.50)
GFR: 85.87 mL/min (ref >60.00)
Glucose, Bld: 104 mg/dL — ABNORMAL HIGH (ref 70–99)
Potassium: 4 mEq/L (ref 3.5–5.1)
Sodium: 140 mEq/L (ref 135–145)
Total Protein: 6.7 g/dL (ref 6.0–8.3)

## 2018-09-22 LAB — LIPID PANEL
CHOLESTEROL: 175 mg/dL (ref 0–200)
HDL: 49.9 mg/dL (ref >39.00)
LDL Cholesterol: 96 mg/dL (ref 0–99)
NonHDL: 124.95
Total CHOL/HDL Ratio: 4
Triglycerides: 146 mg/dL (ref 0.0–149.0)
VLDL: 29.2 mg/dL (ref 0.0–40.0)

## 2018-09-22 LAB — VITAMIN D 25 HYDROXY (VIT D DEFICIENCY, FRACTURES): VITD: 23.2 ng/mL — ABNORMAL LOW (ref 30.00–100.00)

## 2018-09-22 LAB — CBC
HCT: 47.5 % (ref 39.0–52.0)
HEMOGLOBIN: 15.9 g/dL (ref 13.0–17.0)
MCHC: 33.5 g/dL (ref 30.0–36.0)
MCV: 90.1 fl (ref 78.0–100.0)
PLATELETS: 256 10*3/uL (ref 150.0–400.0)
RBC: 5.27 Mil/uL (ref 4.22–5.81)
RDW: 12.7 % (ref 11.5–15.5)
WBC: 7.3 10*3/uL (ref 4.0–10.5)

## 2018-09-22 LAB — PSA: PSA: 1.71 ng/mL (ref 0.10–4.00)

## 2018-09-22 MED ORDER — VITAMIN D (ERGOCALCIFEROL) 1.25 MG (50000 UNIT) PO CAPS: 50000.0000 [IU] | ORAL_CAPSULE | ORAL | 5 refills | Status: DC

## 2018-09-22 NOTE — Progress Notes (Addendum)
Established Patient Office Visit  Subjective:  Patient ID: Brian Gibson, male    DOB: [birth year]  Age: 63 y.o. MRN: [id number]  CC:  Chief Complaint  Patient presents with  . Annual Exam    HPI Brian Gibson presents for complete physical exam and follow-up for his hyperlipidemia and alcohol use.  Patient is accompanied by a signer today.  Patient is fasting.  Patient continues to work.  He is fasting.  He smokes cigars on occasion.  He drinks 3-4 alcoholic drinks daily.  He lives alone.  He has a significant other who also drinks.  2 out of 4 on the cage.  He is no longer taking Paxil. Reviewed last PHQ9.   Past Medical History:  Diagnosis Date  . ACL sprain 06/2015  . Allergy    RHINITIS  . Atypical nevus 2007   dr Gretta Cool, saw him ~ 04-2013  . Congenital deafness   . Ganglion   . Hyperlipidemia     Past Surgical History:  Procedure Laterality Date  . APPENDECTOMY    . GANGLION CYST EXCISION Right    2 times  . HERNIA REPAIR Right 03/2017  . ROTATOR CUFF REPAIR Bilateral 2009  . TONSILLECTOMY  1961  . VASECTOMY      Family History  Problem Relation Age of Onset  . Hypertension Mother        alive at 59 y/o  . Hypertension Father        alive at 1 y/o  . Transient ischemic attack Father   . Prostate cancer Father 37  . Cardiomyopathy Sister        hyperthrophic   . Cancer Neg Hx   . Diabetes Neg Hx   . Stroke Neg Hx   . CAD Neg Hx   . Colon cancer Neg Hx     Social History   Socioeconomic History  . Marital status: Legally Separated    Spouse name: Not on file  . Number of children: 2  . Years of education: Not on file  . Highest education level: Not on file  Occupational History  . Occupation: COMPUTER PROGRAMMER  Social Needs  . Financial resource strain: Not on file  . Food insecurity:    Worry: Not on file    Inability: Not on file  . Transportation needs:    Medical: Not on file    Non-medical: Not on file  Tobacco Use  . Smoking  status: Current Some Day Smoker    Types: Cigarettes, Cigars  . Smokeless tobacco: Never Used  . Tobacco comment: occasional cigar  Substance and Sexual Activity  . Alcohol use: Yes    Alcohol/week: 1.0 standard drinks    Types: 1 Glasses of wine per week    Comment: 2-4 drinks daily  . Drug use: No  . Sexual activity: Not on file  Lifestyle  . Physical activity:    Days per week: Not on file    Minutes per session: Not on file  . Stress: Not on file  Relationships  . Social connections:    Talks on phone: Not on file    Gets together: Not on file    Attends religious service: Not on file    Active member of club or organization: Not on file    Attends meetings of clubs or organizations: Not on file    Relationship status: Not on file  . Intimate partner violence:    Fear of current or ex partner:  Not on file    Emotionally abused: Not on file    Physically abused: Not on file    Forced sexual activity: Not on file  Other Topics Concern  . Not on file  Social History Narrative    Communicate w/ pt via mychart or  #  [phone] ([facility] PHONE)    Outpatient Medications Prior to Visit  Medication Sig Dispense Refill  . atorvastatin (LIPITOR) 40 MG tablet Take 1 tablet (40 mg total) by mouth daily. 90 tablet 2  . diphenhydrAMINE (BENADRYL) 50 MG tablet Take 50 mg by mouth at bedtime as needed for sleep.    . fluticasone (FLONASE) 50 MCG/ACT nasal spray Place 2 sprays into both nostrils daily. 48 g 3  . Multiple Vitamins-Minerals (MULTIVITAMIN ADULT PO) Take 1 tablet by mouth daily.    . sildenafil (VIAGRA) 100 MG tablet Take 0.5-1 tablets (50-100 mg total) by mouth daily as needed for erectile dysfunction. 30 tablet 1  . PARoxetine (PAXIL-CR) 25 MG 24 hr tablet Take 1 tablet (25 mg total) by mouth at bedtime. 90 tablet 1   No facility-administered medications prior to visit.     Allergies  Allergen Reactions  . Erythromycin Nausea And Vomiting    ROS Review of  Systems  Constitutional: Negative.   HENT: Negative.   Eyes: Negative.   Respiratory: Negative.   Cardiovascular: Negative.   Gastrointestinal: Negative.   Endocrine: Negative for polyphagia and polyuria.  Genitourinary: Negative.   Musculoskeletal: Negative.   Skin: Negative for pallor and rash.  Allergic/Immunologic: Negative for immunocompromised state.  Neurological: Negative for light-headedness and numbness.  Hematological: Does not bruise/bleed easily.  Psychiatric/Behavioral: Negative.       Objective:    Physical Exam  Constitutional: He is oriented to person, place, and time. He appears well-developed and well-nourished. No distress.  HENT:  Head: Normocephalic and atraumatic.  Right Ear: External ear normal.  Left Ear: External ear normal.  Mouth/Throat: Oropharynx is clear and moist.  Eyes: Pupils are equal, round, and reactive to light. Conjunctivae are normal. Right eye exhibits no discharge. Left eye exhibits no discharge. No scleral icterus.  Neck: Neck supple. No JVD present. No tracheal deviation present. No thyromegaly present.  Cardiovascular: Normal rate, regular rhythm and normal heart sounds.  Pulmonary/Chest: Effort normal and breath sounds normal. No stridor. No respiratory distress. He has no wheezes. He has no rales.  Abdominal: Soft. Bowel sounds are normal. He exhibits no distension. There is no abdominal tenderness. There is no rebound and no guarding.  Genitourinary: Rectum:     Guaiac result negative.     No rectal mass, anal fissure, tenderness, external hemorrhoid, internal hemorrhoid or abnormal anal tone.  Prostate is not enlarged and not tender.  Musculoskeletal:        General: No edema.  Lymphadenopathy:    He has no cervical adenopathy.  Neurological: He is alert and oriented to person, place, and time.  Skin: Skin is warm and dry. He is not diaphoretic.  Psychiatric: He has a normal mood and affect. His behavior is normal.    BP  124/80   Pulse 70   Ht 5\' 8"  (1.727 m)   Wt 188 lb 6 oz (85.4 kg)   SpO2 97%   BMI 28.64 kg/m  Wt Readings from Last 3 Encounters:  09/22/18 188 lb 6 oz (85.4 kg)  02/01/18 175 lb (79.4 kg)  01/05/18 180 lb 8 oz (81.9 kg)   BP Readings from Last 3  Encounters:  09/22/18 124/80  02/01/18 130/80  01/05/18 122/80   Guideline developer:  UpToDate (see UpToDate for funding source) Date Released: June 2014  There are no preventive care reminders to display for this patient.  There are no preventive care reminders to display for this patient.  Lab Results  Component Value Date   TSH 1.42 09/16/2015   Lab Results  Component Value Date   WBC 7.3 09/22/2018   HGB 15.9 09/22/2018   HCT 47.5 09/22/2018   MCV 90.1 09/22/2018   PLT 256.0 09/22/2018   Lab Results  Component Value Date   NA 140 09/22/2018   K 4.0 09/22/2018   CO2 29 09/22/2018   GLUCOSE 104 (H) 09/22/2018   BUN 22 09/22/2018   CREATININE 0.90 09/22/2018   BILITOT 0.5 09/22/2018   ALKPHOS 55 09/22/2018   AST 24 09/22/2018   ALT 34 09/22/2018   PROT 6.7 09/22/2018   ALBUMIN 4.6 09/22/2018   CALCIUM 9.7 09/22/2018   GFR 85.87 09/22/2018   Lab Results  Component Value Date   CHOL 175 09/22/2018   Lab Results  Component Value Date   HDL 49.90 09/22/2018   Lab Results  Component Value Date   LDLCALC 96 09/22/2018   Lab Results  Component Value Date   TRIG 146.0 09/22/2018   Lab Results  Component Value Date   CHOLHDL 4 09/22/2018   Lab Results  Component Value Date   HGBA1C 5.9 09/21/2017      Assessment & Plan:   Problem List Items Addressed This Visit      Other   Hyperlipidemia   Relevant Orders   Comprehensive metabolic panel (Completed)   Lipid panel (Completed)   Healthcare maintenance   Relevant Orders   Comprehensive metabolic panel (Completed)   PSA (Completed)   Urinalysis, Routine w reflex microscopic (Completed)   VITAMIN D 25 Hydroxy (Vit-D Deficiency, Fractures)  (Completed)   Alcohol use - Primary   Relevant Orders   CBC (Completed)   Comprehensive metabolic panel (Completed)   Ethyl Glucuronide, Urine   Vitamin D deficiency   Relevant Medications   Vitamin D, Ergocalciferol, (DRISDOL) 1.25 MG (50000 UT) CAPS capsule      Meds ordered this encounter  Medications  . Vitamin D, Ergocalciferol, (DRISDOL) 1.25 MG (50000 UT) CAPS capsule    Sig: Take 1 capsule (50,000 Units total) by mouth every 7 (seven) days.    Dispense:  5 capsule    Refill:  5    Follow-up: Return in about 6 months (around 03/23/2019).   Patient was given information on health maintenance and disease prevention.  He was also given information on alcohol use disorder and exercising to lose weight.  He is not motivated to change his drinking habits at this time.

## 2018-09-22 NOTE — Patient Instructions (Signed)
Health Maintenance, Male A healthy lifestyle and preventive care is important for your health and wellness. Ask your health care provider about what schedule of regular examinations is right for you. What should I know about weight and diet? Eat a Healthy Diet  Eat plenty of vegetables, fruits, whole grains, low-fat dairy products, and lean protein.  Do not eat a lot of foods high in solid fats, added sugars, or salt.  Maintain a Healthy Weight Regular exercise can help you achieve or maintain a healthy weight. You should:  Do at least 150 minutes of exercise each week. The exercise should increase your heart rate and make you sweat (moderate-intensity exercise).  Do strength-training exercises at least twice a week. Watch Your Levels of Cholesterol and Blood Lipids  Have your blood tested for lipids and cholesterol every 5 years starting at 61 years of age. If you are at high risk for heart disease, you should start having your blood tested when you are 61 years old. You may need to have your cholesterol levels checked more often if: ? Your lipid or cholesterol levels are high. ? You are older than 61 years of age. ? You are at high risk for heart disease. What should I know about cancer screening? Many types of cancers can be detected early and may often be prevented. Lung Cancer  You should be screened every year for lung cancer if: ? You are a current smoker who has smoked for at least 30 years. ? You are a former smoker who has quit within the past 15 years.  Talk to your health care provider about your screening options, when you should start screening, and how often you should be screened. Colorectal Cancer  Routine colorectal cancer screening usually begins at 61 years of age and should be repeated every 5-10 years until you are 61 years old. You may need to be screened more often if early forms of precancerous polyps or small growths are found. Your health care provider may  recommend screening at an earlier age if you have risk factors for colon cancer.  Your health care provider may recommend using home test kits to check for hidden blood in the stool.  A small camera at the end of a tube can be used to examine your colon (sigmoidoscopy or colonoscopy). This checks for the earliest forms of colorectal cancer. Prostate and Testicular Cancer  Depending on your age and overall health, your health care provider may do certain tests to screen for prostate and testicular cancer.  Talk to your health care provider about any symptoms or concerns you have about testicular or prostate cancer. Skin Cancer  Check your skin from head to toe regularly.  Tell your health care provider about any new moles or changes in moles, especially if: ? There is a change in a mole's size, shape, or color. ? You have a mole that is larger than a pencil eraser.  Always use sunscreen. Apply sunscreen liberally and repeat throughout the day.  Protect yourself by wearing long sleeves, pants, a wide-brimmed hat, and sunglasses when outside. What should I know about heart disease, diabetes, and high blood pressure?  If you are 18-39 years of age, have your blood pressure checked every 3-5 years. If you are 40 years of age or older, have your blood pressure checked every year. You should have your blood pressure measured twice-once when you are at a hospital or clinic, and once when you are not at a hospital   or clinic. Record the average of the two measurements. To check your blood pressure when you are not at a hospital or clinic, you can use: ? An automated blood pressure machine at a pharmacy. ? A home blood pressure monitor.  Talk to your health care provider about your target blood pressure.  If you are between 2-68 years old, ask your health care provider if you should take aspirin to prevent heart disease.  Have regular diabetes screenings by checking your fasting blood sugar  level. ? If you are at a normal weight and have a low risk for diabetes, have this test once every three years after the age of 36. ? If you are overweight and have a high risk for diabetes, consider being tested at a younger age or more often.  A one-time screening for abdominal aortic aneurysm (AAA) by ultrasound is recommended for men aged 77-75 years who are current or former smokers. What should I know about preventing infection? Hepatitis B If you have a higher risk for hepatitis B, you should be screened for this virus. Talk with your health care provider to find out if you are at risk for hepatitis B infection. Hepatitis C Blood testing is recommended for:  Everyone born from [birth year] through 1963.  Anyone with known risk factors for hepatitis C. Sexually Transmitted Diseases (STDs)  You should be screened each year for STDs including gonorrhea and chlamydia if: ? You are sexually active and are younger than 61 years of age. ? You are older than 61 years of age and your health care provider tells you that you are at risk for this type of infection. ? Your sexual activity has changed since you were last screened and you are at an increased risk for chlamydia or gonorrhea. Ask your health care provider if you are at risk.  Talk with your health care provider about whether you are at high risk of being infected with HIV. Your health care provider may recommend a prescription medicine to help prevent HIV infection. What else can I do?  Schedule regular health, dental, and eye exams.  Stay current with your vaccines (immunizations).  Do not use any tobacco products, such as cigarettes, chewing tobacco, and e-cigarettes. If you need help quitting, ask your health care provider.  Limit alcohol intake to no more than 2 drinks per day. One drink equals 12 ounces of beer, 5 ounces of wine, or 1 ounces of hard liquor.  Do not use street drugs.  Do not share needles.  Ask your health  care provider for help if you need support or information about quitting drugs.  Tell your health care provider if you often feel depressed.  Tell your health care provider if you have ever been abused or do not feel safe at home. This information is not intended to replace advice given to you by your health care provider. Make sure you discuss any questions you have with your health care provider. Document Released: 02/06/2008 Document Revised: 04/08/2016 Document Reviewed: 05/14/2015 Elsevier Interactive Patient Education  2019 [employer].  Alcohol Use Disorder Alcohol use disorder is when your drinking disrupts your daily life. When you have this condition, you drink too much alcohol and you cannot control your drinking. Alcohol use disorder can cause serious problems with your physical health. It can affect your brain, heart, liver, pancreas, immune system, stomach, and intestines. Alcohol use disorder can increase your risk for certain cancers and cause problems with your mental health, such  as depression, anxiety, psychosis, delirium, and dementia. People with this disorder risk hurting themselves and others. What are the causes? This condition is caused by drinking too much alcohol over time. It is not caused by drinking too much alcohol only one or two times. Some people with this condition drink alcohol to cope with or escape from negative life events. Others drink to relieve pain or symptoms of mental illness. What increases the risk? You are more likely to develop this condition if:  You have a family history of alcohol use disorder.  Your culture encourages drinking to the point of intoxication, or makes alcohol easy to get.  You had a mood or conduct disorder in childhood.  You have been a victim of abuse.  You are an adolescent and: ? You have poor grades or difficulties in school. ? Your caregivers do not talk to you about saying no to alcohol, or supervise your  activities. ? You are impulsive or you have trouble with self-control. What are the signs or symptoms? Symptoms of this condition include:  Drinkingmore than you want to.  Drinking for longer than you want to.  Trying several times to drink less or to control your drinking.  Spending a lot of time getting alcohol, drinking, or recovering from drinking.  Craving alcohol.  Having problems at work, at school, or at home due to drinking.  Having problems in relationships due to drinking.  Drinking when it is dangerous to drink, such as before driving a car.  Continuing to drink even though you know you might have a physical or mental problem related to drinking.  Needing more and more alcohol to get the same effect you want from the alcohol (building up tolerance).  Having symptoms of withdrawal when you stop drinking. Symptoms of withdrawal include: ? Fatigue. ? Nightmares. ? Trouble sleeping. ? Depression. ? Anxiety. ? Fever. ? Seizures. ? Severe confusion. ? Feeling or seeing things that are not there (hallucinations). ? Tremors. ? Rapid heart rate. ? Rapid breathing. ? High blood pressure.  Drinking to avoid symptoms of withdrawal. How is this diagnosed? This condition is diagnosed with an assessment. Your health care provider may start the assessment by asking three or four questions about your drinking. Your health care provider may perform a physical exam or do lab tests to see if you have physical problems resulting from alcohol use. She or he may refer you to a mental health professional for evaluation. How is this treated? Some people with alcohol use disorder are able to reduce their alcohol use to low-risk levels. Others need to completely quit drinking alcohol. When necessary, mental health professionals with specialized training in substance use treatment can help. Your health care provider can help you decide how severe your alcohol use disorder is and what  type of treatment you need. The following forms of treatment are available:  Detoxification. Detoxification involves quitting drinking and using prescription medicines within the first week to help lessen withdrawal symptoms. This treatment is important for people who have had withdrawal symptoms before and for heavy drinkers who are likely to have withdrawal symptoms. Alcohol withdrawal can be dangerous, and in severe cases, it can cause death. Detoxification may be provided in a home, community, or primary care setting, or in a hospital or substance use treatment facility.  Counseling. This treatment is also called talk therapy. It is provided by substance use treatment counselors. A counselor can address the reasons you use alcohol and suggest ways to  keep you from drinking again or to prevent problem drinking. The goals of talk therapy are to: ? Find healthy activities and ways for you to cope with stress. ? Identify and avoid the things that trigger your alcohol use. ? Help you learn how to handle cravings.  Medicines.Medicines can help treat alcohol use disorder by: ? Decreasing alcohol cravings. ? Decreasing the positive feeling you have when you drink alcohol. ? Causing an uncomfortable physical reaction when you drink alcohol (aversion therapy).  Support groups. Support groups are led by people who have quit drinking. They provide emotional support, advice, and guidance. These forms of treatment are often combined. Some people with this condition benefit from a combination of treatments provided by specialized substance use treatment centers. Follow these instructions at home:  Take over-the-counter and prescription medicines only as told by your health care provider.  Check with your health care provider before starting any new medicines.  Ask friends and family members not to offer you alcohol.  Avoid situations where alcohol is served, including gatherings where others are  drinking alcohol.  Create a plan for what to do when you are tempted to use alcohol.  Find hobbies or activities that you enjoy that do not include alcohol.  Keep all follow-up visits as told by your health care provider. This is important. How is this prevented?  If you drink, limit alcohol intake to no more than 1 drink a day for nonpregnant women and 2 drinks a day for men. One drink equals 12 oz of beer, 5 oz of wine, or 1 oz of hard liquor.  If you have a mental health condition, get treatment and support.  Do not give alcohol to adolescents.  If you are an adolescent: ? Do not drink alcohol. ? Do not be afraid to say no if someone offers you alcohol. Speak up about why you do not want to drink. You can be a positive role model for your friends and set a good example for those around you by not drinking alcohol. ? If your friends drink, spend time with others who do not drink alcohol. Make new friends who do not use alcohol. ? Find healthy ways to manage stress and emotions, such as meditation or deep breathing, exercise, spending time in nature, listening to music, or talking with a trusted friend or family member. Contact a health care provider if:  You are not able to take your medicines as told.  Your symptoms get worse.  You return to drinking alcohol (relapse) and your symptoms get worse. Get help right away if:  You have thoughts about hurting yourself or others. If you ever feel like you may hurt yourself or others, or have thoughts about taking your own life, get help right away. You can go to your nearest emergency department or call:  Your local emergency services (911 in the [place]  A suicide crisis helpline, such as the [facility] at 774-530-3407. This is open 24 hours a day. Summary  Alcohol use disorder is when your drinking disrupts your daily life. When you have this condition, you drink too much alcohol and you cannot control  your drinking.  Treatment may include detoxification, counseling, medicine, and support groups.  Ask friends and family members not to offer you alcohol. Avoid situations where alcohol is served.  Get help right away if you have thoughts about hurting yourself or others. This information is not intended to replace advice given to you by your  health care provider. Make sure you discuss any questions you have with your health care provider. Document Released: 09/17/2004 Document Revised: 05/07/2016 Document Reviewed: 05/07/2016 Elsevier Interactive Patient Education  2019 [employer].  Exercising to Stay Healthy To become healthy and stay healthy, it is recommended that you do moderate-intensity and vigorous-intensity exercise. You can tell that you are exercising at a moderate intensity if your heart starts beating faster and you start breathing faster but can still hold a conversation. You can tell that you are exercising at a vigorous intensity if you are breathing much harder and faster and cannot hold a conversation while exercising. Exercising regularly is important. It has many health benefits, such as:  Improving overall fitness, flexibility, and endurance.  Increasing bone density.  Helping with weight control.  Decreasing body fat.  Increasing muscle strength.  Reducing stress and tension.  Improving overall health. How often should I exercise? Choose an activity that you enjoy, and set realistic goals. Your health care provider can help you make an activity plan that works for you. Exercise regularly as told by your health care provider. This may include:  Doing strength training two times a week, such as: ? Lifting weights. ? Using resistance bands. ? Push-ups. ? Sit-ups. ? Yoga.  Doing a certain intensity of exercise for a given amount of time. Choose from these options: ? A total of 150 minutes of moderate-intensity exercise every week. ? A total of 75 minutes  of vigorous-intensity exercise every week. ? A mix of moderate-intensity and vigorous-intensity exercise every week. Children, pregnant women, people who have not exercised regularly, people who are overweight, and older adults may need to talk with a health care provider about what activities are safe to do. If you have a medical condition, be sure to talk with your health care provider before you start a new exercise program. What are some exercise ideas? Moderate-intensity exercise ideas include:  Walking 1 mile (1.6 km) in about 15 minutes.  Biking.  Hiking.  Golfing.  Dancing.  Water aerobics. Vigorous-intensity exercise ideas include:  Walking 4.5 miles (7.2 km) or more in about 1 hour.  Jogging or running 5 miles (8 km) in about 1 hour.  Biking 10 miles (16.1 km) or more in about 1 hour.  Lap swimming.  Roller-skating or in-line skating.  Cross-country skiing.  Vigorous competitive sports, such as football, basketball, and soccer.  Jumping rope.  Aerobic dancing. What are some everyday activities that can help me to get exercise?  [place] work, such as: ? Pushing a Conservation officer, nature. ? Raking and bagging leaves.  Washing your car.  Pushing a stroller.  Shoveling snow.  Gardening.  Washing windows or floors. How can I be more active in my day-to-day activities?  Use stairs instead of an elevator.  Take a walk during your lunch break.  If you drive, park your car farther away from your work or school.  If you take public transportation, get off one stop early and walk the rest of the way.  Stand up or walk around during all of your indoor phone calls.  Get up, stretch, and walk around every 30 minutes throughout the day.  Enjoy exercise with a friend. Support to continue exercising will help you keep a regular routine of activity. What guidelines can I follow while exercising?  Before you start a new exercise program, talk with your health care  provider.  Do not exercise so much that you hurt yourself, feel dizzy, or  get very short of breath.  Wear comfortable clothes and wear shoes with good support.  Drink plenty of water while you exercise to prevent dehydration or heat stroke.  Work out until your breathing and your heartbeat get faster. Where to find more information  [place] Department of Health and Human Services: [employer]  Centers for Disease Control and Prevention (CDC): http://www.wolf.info/ Summary  Exercising regularly is important. It will improve your overall fitness, flexibility, and endurance.  Regular exercise also will improve your overall health. It can help you control your weight, reduce stress, and improve your bone density.  Do not exercise so much that you hurt yourself, feel dizzy, or get very short of breath.  Before you start a new exercise program, talk with your health care provider. This information is not intended to replace advice given to you by your health care provider. Make sure you discuss any questions you have with your health care provider. Document Released: 09/12/2010 Document Revised: 07/01/2017 Document Reviewed: 07/01/2017 Elsevier Interactive Patient Education  2019 [employer].

## 2018-09-22 NOTE — Addendum Note (Signed)
Addended by: Jon Billings on: 09/22/2018 02:57 PM   Modules accepted: Orders

## 2018-09-27 LAB — ETHYL GLUCURONIDE LC/MS/MS
ETHYL GLUCURONIDE LC/MS/MS: 1143 ng/mL
EtG/EtS LC/MS/MS: POSITIVE — AB
Ethyl Glucuronide: POSITIVE — AB
Ethyl Sulfate LC/MS/MS: 370 ng/mL
Ethyl Sulfate: POSITIVE — AB

## 2018-09-27 LAB — ETHYL GLUCURONIDE, URINE

## 2018-10-06 ENCOUNTER — Other Ambulatory Visit: Payer: Self-pay | Admitting: Internal Medicine

## 2018-10-06 DIAGNOSIS — E782 Mixed hyperlipidemia: Secondary | ICD-10-CM

## 2018-10-12 ENCOUNTER — Encounter: Payer: Self-pay | Admitting: Family Medicine

## 2018-10-12 DIAGNOSIS — E782 Mixed hyperlipidemia: Secondary | ICD-10-CM

## 2018-10-12 MED ORDER — ATORVASTATIN CALCIUM 40 MG PO TABS: 40.0000 mg | ORAL_TABLET | Freq: Every day | ORAL | 3 refills | Status: DC

## 2018-12-14 ENCOUNTER — Encounter: Payer: Self-pay | Admitting: Family Medicine

## 2018-12-15 ENCOUNTER — Ambulatory Visit (INDEPENDENT_AMBULATORY_CARE_PROVIDER_SITE_OTHER): Payer: BLUE CROSS/BLUE SHIELD | Admitting: Family Medicine

## 2018-12-15 ENCOUNTER — Encounter: Payer: Self-pay | Admitting: Family Medicine

## 2018-12-15 VITALS — BP 120/70 | HR 77 | Ht 68.0 in | Wt 189.0 lb

## 2018-12-15 DIAGNOSIS — Z789 Other specified health status: Secondary | ICD-10-CM

## 2018-12-15 DIAGNOSIS — S76211A Strain of adductor muscle, fascia and tendon of right thigh, initial encounter: Secondary | ICD-10-CM

## 2018-12-15 DIAGNOSIS — Q169 Congenital malformation of ear causing impairment of hearing, unspecified: Secondary | ICD-10-CM

## 2018-12-15 DIAGNOSIS — Z7289 Other problems related to lifestyle: Secondary | ICD-10-CM | POA: Diagnosis not present

## 2018-12-15 DIAGNOSIS — S76219A Strain of adductor muscle, fascia and tendon of unspecified thigh, initial encounter: Secondary | ICD-10-CM | POA: Insufficient documentation

## 2018-12-15 NOTE — Progress Notes (Signed)
Established Patient Office Visit  Subjective:  Patient ID: Brian Gibson, male    DOB: 10-02-1955  Age: 63 y.o. MRN: [id number]  CC:  Chief Complaint  Patient presents with  . Hernia    HPI Brian Gibson presents for treatment evaluation of discomfort in his right inguinal area.  This started after he had engaged in heavy lifting while moving.  He feels no mass in this area.  Significant past medical history of hernia repair on this side with placement of mesh.  The repair is done well today.  Denies dysuria discharge or tenderness in his testicle.  Significantly curtailed his drinking.  1/5 of hard liquor is now lasting him 2 to 3 weeks.  Past Medical History:  Diagnosis Date  . ACL sprain 06/2015  . Allergy    RHINITIS  . Atypical nevus 2007   dr Gretta Cool, saw him ~ 04-2013  . Congenital deafness   . Ganglion   . Hyperlipidemia     Past Surgical History:  Procedure Laterality Date  . APPENDECTOMY    . GANGLION CYST EXCISION Right    2 times  . HERNIA REPAIR Right 03/2017  . ROTATOR CUFF REPAIR Bilateral 2009  . TONSILLECTOMY  1961  . VASECTOMY      Family History  Problem Relation Age of Onset  . Hypertension Mother        alive at 65 y/o  . Hypertension Father        alive at 36 y/o  . Transient ischemic attack Father   . Prostate cancer Father 89  . Cardiomyopathy Sister        hyperthrophic   . Cancer Neg Hx   . Diabetes Neg Hx   . Stroke Neg Hx   . CAD Neg Hx   . Colon cancer Neg Hx     Social History   Socioeconomic History  . Marital status: Legally Separated    Spouse name: Not on file  . Number of children: 2  . Years of education: Not on file  . Highest education level: Not on file  Occupational History  . Occupation: COMPUTER PROGRAMMER  Social Needs  . Financial resource strain: Not on file  . Food insecurity:    Worry: Not on file    Inability: Not on file  . Transportation needs:    Medical: Not on file    Non-medical: Not on  file  Tobacco Use  . Smoking status: Current Some Day Smoker    Types: Cigarettes, Cigars  . Smokeless tobacco: Never Used  . Tobacco comment: occasional cigar  Substance and Sexual Activity  . Alcohol use: Yes    Comment: 1-2 drinks daily  . Drug use: No  . Sexual activity: Not on file  Lifestyle  . Physical activity:    Days per week: Not on file    Minutes per session: Not on file  . Stress: Not on file  Relationships  . Social connections:    Talks on phone: Not on file    Gets together: Not on file    Attends religious service: Not on file    Active member of club or organization: Not on file    Attends meetings of clubs or organizations: Not on file    Relationship status: Not on file  . Intimate partner violence:    Fear of current or ex partner: Not on file    Emotionally abused: Not on file    Physically abused: Not  on file    Forced sexual activity: Not on file  Other Topics Concern  . Not on file  Social History Narrative    Communicate w/ pt via mychart or  #  [phone] (RELAY PHONE)    Outpatient Medications Prior to Visit  Medication Sig Dispense Refill  . atorvastatin (LIPITOR) 40 MG tablet Take 1 tablet (40 mg total) by mouth daily. 90 tablet 3  . diphenhydrAMINE (BENADRYL) 50 MG tablet Take 50 mg by mouth at bedtime as needed for sleep.    . fluticasone (FLONASE) 50 MCG/ACT nasal spray Place 2 sprays into both nostrils daily. 48 g 3  . sildenafil (VIAGRA) 100 MG tablet Take 0.5-1 tablets (50-100 mg total) by mouth daily as needed for erectile dysfunction. 30 tablet 1  . Vitamin D, Ergocalciferol, (DRISDOL) 1.25 MG (50000 UT) CAPS capsule Take 1 capsule (50,000 Units total) by mouth every 7 (seven) days. 5 capsule 5  . Multiple Vitamins-Minerals (MULTIVITAMIN ADULT PO) Take 1 tablet by mouth daily.     No facility-administered medications prior to visit.     Allergies  Allergen Reactions  . Erythromycin Nausea And Vomiting    ROS Review of  Systems  Constitutional: Negative.   Respiratory: Negative.   Cardiovascular: Negative.   Gastrointestinal: Negative.  Negative for abdominal distention, abdominal pain, blood in stool, constipation, nausea and vomiting.  Genitourinary: Negative for difficulty urinating, discharge, dysuria and testicular pain.  Neurological: Negative.   Hematological: Negative.   Psychiatric/Behavioral: Negative.       Objective:    Physical Exam  Constitutional: He is oriented to person, place, and time. He appears well-developed and well-nourished. No distress.  HENT:  Head: Normocephalic and atraumatic.  Right Ear: External ear normal.  Left Ear: External ear normal.  Eyes: Right eye exhibits no discharge. Left eye exhibits no discharge. No scleral icterus.  Neck: No JVD present. No tracheal deviation present.  Pulmonary/Chest: Effort normal. No stridor.  Abdominal: Hernia confirmed negative in the right inguinal area and confirmed negative in the left inguinal area.  Genitourinary: Right testis shows no mass, no swelling and no tenderness. Right testis is descended. Left testis shows no mass, no swelling and no tenderness. Left testis is descended. Circumcised. No hypospadias, penile erythema or penile tenderness. No discharge found.  Lymphadenopathy:       Right: No inguinal adenopathy present.       Left: No inguinal adenopathy present.  Neurological: He is alert and oriented to person, place, and time.  Skin: Skin is warm and dry. He is not diaphoretic.  Psychiatric: He has a normal mood and affect. His behavior is normal.    BP 120/70   Pulse 77   Ht 5\' 8"  (1.727 m)   Wt 189 lb (85.7 kg)   SpO2 96%   BMI 28.74 kg/m  Wt Readings from Last 3 Encounters:  12/15/18 189 lb (85.7 kg)  09/22/18 188 lb 6 oz (85.4 kg)  02/01/18 175 lb (79.4 kg)   BP Readings from Last 3 Encounters:  12/15/18 120/70  09/22/18 124/80  02/01/18 130/80   Guideline developer:  UpToDate (see UpToDate for  funding source) Date Released: June 2014  There are no preventive care reminders to display for this patient.  There are no preventive care reminders to display for this patient.  Lab Results  Component Value Date   TSH 1.42 09/16/2015   Lab Results  Component Value Date   WBC 7.3 09/22/2018   HGB 15.9 09/22/2018  HCT 47.5 09/22/2018   MCV 90.1 09/22/2018   PLT 256.0 09/22/2018   Lab Results  Component Value Date   NA 140 09/22/2018   K 4.0 09/22/2018   CO2 29 09/22/2018   GLUCOSE 104 (H) 09/22/2018   BUN 22 09/22/2018   CREATININE 0.90 09/22/2018   BILITOT 0.5 09/22/2018   ALKPHOS 55 09/22/2018   AST 24 09/22/2018   ALT 34 09/22/2018   PROT 6.7 09/22/2018   ALBUMIN 4.6 09/22/2018   CALCIUM 9.7 09/22/2018   GFR 85.87 09/22/2018   Lab Results  Component Value Date   CHOL 175 09/22/2018   Lab Results  Component Value Date   HDL 49.90 09/22/2018   Lab Results  Component Value Date   LDLCALC 96 09/22/2018   Lab Results  Component Value Date   TRIG 146.0 09/22/2018   Lab Results  Component Value Date   CHOLHDL 4 09/22/2018   Lab Results  Component Value Date   HGBA1C 5.9 09/21/2017      Assessment & Plan:   Problem List Items Addressed This Visit      Nervous and Auditory   Congenital anomaly of ear causing impairment of hearing     Musculoskeletal and Integument   Groin strain     Other   Alcohol use - Primary      No orders of the defined types were placed in this encounter.   Follow-up: Return existing appointment scheduled. .   Patient will try Aleve for couple of weeks and let me know if the tenderness does not improve.  Will refer for general surgeon recheck if not improving.  Congratulated him on his moderation of his alcohol intake.  Follow-up appointment with me in July already scheduled.

## 2019-01-24 ENCOUNTER — Encounter: Payer: Self-pay | Admitting: Family Medicine

## 2019-03-30 ENCOUNTER — Other Ambulatory Visit: Payer: Self-pay | Admitting: Family Medicine

## 2019-03-30 DIAGNOSIS — E559 Vitamin D deficiency, unspecified: Secondary | ICD-10-CM

## 2019-04-07 DIAGNOSIS — D225 Melanocytic nevi of trunk: Secondary | ICD-10-CM | POA: Diagnosis not present

## 2019-04-07 DIAGNOSIS — X32XXXD Exposure to sunlight, subsequent encounter: Secondary | ICD-10-CM | POA: Diagnosis not present

## 2019-04-07 DIAGNOSIS — L570 Actinic keratosis: Secondary | ICD-10-CM | POA: Diagnosis not present

## 2019-04-07 DIAGNOSIS — Z1283 Encounter for screening for malignant neoplasm of skin: Secondary | ICD-10-CM | POA: Diagnosis not present

## 2019-04-21 ENCOUNTER — Encounter: Payer: Self-pay | Admitting: Family Medicine

## 2019-05-26 ENCOUNTER — Encounter: Payer: Self-pay | Admitting: Family Medicine

## 2019-05-26 ENCOUNTER — Other Ambulatory Visit: Payer: Self-pay | Admitting: Internal Medicine

## 2019-07-11 ENCOUNTER — Ambulatory Visit: Payer: BC Managed Care – PPO

## 2019-07-13 ENCOUNTER — Encounter: Payer: Self-pay | Admitting: Family Medicine

## 2019-07-14 DIAGNOSIS — Z20828 Contact with and (suspected) exposure to other viral communicable diseases: Secondary | ICD-10-CM | POA: Diagnosis not present

## 2019-07-18 ENCOUNTER — Other Ambulatory Visit: Payer: Self-pay

## 2019-07-19 ENCOUNTER — Encounter: Payer: Self-pay | Admitting: Family Medicine

## 2019-07-19 ENCOUNTER — Ambulatory Visit (INDEPENDENT_AMBULATORY_CARE_PROVIDER_SITE_OTHER): Payer: BC Managed Care – PPO

## 2019-07-19 DIAGNOSIS — Z23 Encounter for immunization: Secondary | ICD-10-CM

## 2019-07-19 NOTE — Progress Notes (Signed)
Patient came into the office today to receive the flu vaccine. Given in left deltoid. Patient tolerated injection well.

## 2019-08-08 DIAGNOSIS — Z20828 Contact with and (suspected) exposure to other viral communicable diseases: Secondary | ICD-10-CM | POA: Diagnosis not present

## 2019-08-30 ENCOUNTER — Encounter: Payer: Self-pay | Admitting: Family Medicine

## 2019-09-25 ENCOUNTER — Encounter: Payer: BLUE CROSS/BLUE SHIELD | Admitting: Family Medicine

## 2019-09-27 DIAGNOSIS — Z0000 Encounter for general adult medical examination without abnormal findings: Secondary | ICD-10-CM | POA: Diagnosis not present

## 2019-09-27 DIAGNOSIS — E785 Hyperlipidemia, unspecified: Secondary | ICD-10-CM | POA: Diagnosis not present

## 2019-09-27 DIAGNOSIS — Z125 Encounter for screening for malignant neoplasm of prostate: Secondary | ICD-10-CM | POA: Diagnosis not present

## 2019-09-27 DIAGNOSIS — Z131 Encounter for screening for diabetes mellitus: Secondary | ICD-10-CM | POA: Diagnosis not present

## 2019-09-27 DIAGNOSIS — E559 Vitamin D deficiency, unspecified: Secondary | ICD-10-CM | POA: Diagnosis not present

## 2019-09-27 DIAGNOSIS — Z136 Encounter for screening for cardiovascular disorders: Secondary | ICD-10-CM | POA: Diagnosis not present

## 2019-09-27 DIAGNOSIS — F419 Anxiety disorder, unspecified: Secondary | ICD-10-CM | POA: Diagnosis not present

## 2019-11-01 ENCOUNTER — Other Ambulatory Visit: Payer: Self-pay | Admitting: Internal Medicine

## 2019-11-01 DIAGNOSIS — E782 Mixed hyperlipidemia: Secondary | ICD-10-CM

## 2020-01-26 DIAGNOSIS — L821 Other seborrheic keratosis: Secondary | ICD-10-CM | POA: Diagnosis not present

## 2020-01-26 DIAGNOSIS — L570 Actinic keratosis: Secondary | ICD-10-CM | POA: Diagnosis not present

## 2020-01-26 DIAGNOSIS — D2339 Other benign neoplasm of skin of other parts of face: Secondary | ICD-10-CM | POA: Diagnosis not present

## 2020-01-26 DIAGNOSIS — Z7189 Other specified counseling: Secondary | ICD-10-CM | POA: Diagnosis not present

## 2020-04-08 ENCOUNTER — Other Ambulatory Visit: Payer: Self-pay | Admitting: Family Medicine

## 2020-04-08 DIAGNOSIS — E559 Vitamin D deficiency, unspecified: Secondary | ICD-10-CM

## 2023-10-06 ENCOUNTER — Encounter: Payer: Self-pay | Admitting: Internal Medicine
